# Patient Record
Sex: Female | Born: 1940 | Race: White | Marital: Married | Smoking: Never smoker
Health system: Northeastern US, Academic
[De-identification: ages and names within clinical notes are randomized; demographics above are authoritative.]

## PROBLEM LIST (undated history)

## (undated) DIAGNOSIS — I10 Essential (primary) hypertension: Secondary | ICD-10-CM

## (undated) HISTORY — DX: Essential (primary) hypertension: I10

## (undated) HISTORY — PX: BACK SURGERY: SHX140

---

## 2006-07-30 DIAGNOSIS — E785 Hyperlipidemia, unspecified: Secondary | ICD-10-CM | POA: Insufficient documentation

## 2006-07-30 DIAGNOSIS — I10 Essential (primary) hypertension: Secondary | ICD-10-CM | POA: Insufficient documentation

## 2006-09-04 DIAGNOSIS — M129 Arthropathy, unspecified: Secondary | ICD-10-CM | POA: Insufficient documentation

## 2006-09-04 DIAGNOSIS — M549 Dorsalgia, unspecified: Secondary | ICD-10-CM | POA: Insufficient documentation

## 2006-09-04 DIAGNOSIS — IMO0001 Reserved for inherently not codable concepts without codable children: Secondary | ICD-10-CM | POA: Insufficient documentation

## 2019-04-06 ENCOUNTER — Ambulatory Visit: Payer: Medicare Other | Admitting: Psychiatry

## 2019-04-06 ENCOUNTER — Encounter: Payer: Self-pay | Admitting: Psychiatry

## 2019-04-06 VITALS — BP 148/80 | Temp 95.5°F | Ht 67.0 in | Wt 194.0 lb

## 2019-04-06 DIAGNOSIS — R0989 Other specified symptoms and signs involving the circulatory and respiratory systems: Secondary | ICD-10-CM

## 2019-04-06 DIAGNOSIS — R059 Cough, unspecified: Secondary | ICD-10-CM

## 2019-04-06 DIAGNOSIS — R05 Cough: Secondary | ICD-10-CM

## 2019-04-06 DIAGNOSIS — K219 Gastro-esophageal reflux disease without esophagitis: Secondary | ICD-10-CM

## 2019-04-06 NOTE — Progress Notes (Signed)
Katrina Alexander was referred by Dr. No ref. provider found.    Subjective  Chief Complaint: Katrina Alexander presents today for cough      HPI: Katrina Alexander reports that Katrina Alexander was having a sensation of postnasal drip.  Katrina Alexander uses fluticasone nasal spray and reports that initially this has been helpful to Katrina Alexander.  However, now Katrina Alexander is concerned that Katrina Alexander is having worsening postnasal drip as Katrina Alexander finds Katrina Alexander is coughing.  Katrina Alexander has a sensation of mucus in Katrina Alexander throat. Katrina Alexander reports that Katrina Alexander cough is often worse at night.  Katrina Alexander is on pantoprazole for acid reflux and recently had the addition of Pepcid.  In discussion on Katrina Alexander medications, Katrina Alexander reports that Katrina Alexander recently was started on lisinopril with hydrochlorothiazide and had previously only been on hydrochlorothiazide.  Katrina Alexander does not believe that Katrina Alexander was on lisinopril at all up until the recent addition of it in combination to the hydrochlorothiazide.  Katrina Alexander believes Katrina Alexander cough started after the addition of the lisinopril.        Outpatient Medications Marked as Taking for the 04/06/19 encounter (PA Office Visit) with Eliezer Bottom, PA   Medication Sig Dispense Refill    benzonatate (TESSALON) 200 MG capsule Take 200 mg by mouth 3 times daily as needed      famotidine (PEPCID) 20 MG tablet Take 20 mg by mouth 2 times daily as needed      lisinopril-hydrochlorothiazide (PRINZIDE,ZESTORETIC) 10-12.5 MG per tablet Take 1 tablet by mouth daily      LORazepam (ATIVAN) 1 MG tablet SMARTSIG:0.5 Tablet(s) By Mouth Twice Daily PRN      ondansetron (ZOFRAN-ODT) 8 MG disintegrating tablet Take 8 mg by mouth every 8 hours      simvastatin (ZOCOR) 40 MG tablet Take 40 mg by mouth daily      oxyCODONE-acetaminophen (PERCOCET) 5-325 MG per tablet Take 1 tablet by mouth every 6 hours as needed      fluticasone (FLONASE) 50 MCG/ACT nasal spray Spray 1 spray into nostril daily      pantoprazole (PROTONIX) 20 MG EC tablet   0    aspirin 81 MG tablet TAKE 1 TABLET DAILY.  0    hydrochlorothiazide (HYDRODIURIL)  12.5 MG tablet   0       Allergy:  Nsaids and Penicillins    Problem List: has Lower Back Pain Chronic; Hyperlipidemia; Hypertension; Arthropathy Of The Head / Neck / Trunk; Backache; and Myalgia And Myositis on their problem list.    Medical History:   Past Medical History:   Diagnosis Date    Hypertension        Surgical History:   Past Surgical History:   Procedure Laterality Date    BACK SURGERY          Family History: family history includes Brain Aneurysm in Katrina Alexander mother; Diabetes in Katrina Alexander brother; Heart failure in Katrina Alexander sister; Leukemia in Katrina Alexander brother; Prostate cancer in Katrina Alexander father.    Social History:   Social History     Tobacco Use    Smoking status: Never Smoker    Smokeless tobacco: Never Used   Substance Use Topics    Alcohol use: Not on file         Objective  Vitals:    04/06/19 1408   BP: 148/80   Temp: 35.3 C (95.5 F)   Weight: 88 kg (194 lb)   Height: 1.702 m (5\' 7" )       Body mass index is 30.38 kg/m.  Exam  Constitution:  Appears well developed and well nourished. No signs of acute distress present. Katrina Alexander is cooperative and overall behavior is appropriate.  Head / Face: Atraumatic, normocephalic on inspection. No scars present..  Ears: Inspection reveals no lesion of the ears, swelling of the ears, or tenderness of the ears.  No discharge, mass or stenosis in the auditory canals. translucent, normal landmarks, and no retraction. No swelling or tenderness of the post auricular area bilaterally.  Nose: External Nose: exhibits no deformity or lesions. Internal Nose: No discharge from the nasal mucosae, no edema, no erythema of the nasal mucosae. Nasal Septum: not significantly deviated or perforated. Nasal turbinates are normal in color and size.  Oral cavity: Lips appear normal and healthy. Dentition is normal for age. Gums appear healthy. Tongue shows a smooth surface and symmetry. Floor of the mouth appears normal. Salivary glands normal in size with no asymmetry. Submandibular and  parotid ducts are patent bilaterally. Oral mucosa moist with no thrush and no mucositis. Hard palate normal in appearance.  Oropharynx: No lesions or masses. Soft palate normal in appearance. Uvula midline and normal in size. Posterior pharyngeal mucosa appears normal.  Neck: Symmetric. Palpation reveals no swelling or tenderness. No masses appreciated. Trachea is midline and has good landmarks. Thyroid exhibits no palpable enlargement, nodules or tenderness on palpation.  Respiratory: Respiration rate is normal.  Neurological: Alert and oriented x 3.  Psychological: Mood is normal. Katrina Alexander's affect is appropriate to mood. Speech is spontaneous with regular rate, rhythm, and volume.     Nasopharyngoscopy    verbal consent given for procedure.    After 5-10 minutes, flexible endocope inserted through both nasal cavities.    Normal nasal cavity, nasopharynx, hypopharynx and larynx.  Normal tongue base.  Normal bilateral true vocal cord motion.  No mass lesions identified.       The procedure lasted less than 5 minutes and the endoscope was then removed.  The Katrina Alexander tolerated the procedure well without complications.        Assessment  79 y.o. female    ICD-10-CM ICD-9-CM    1. Gastroesophageal reflux disease, unspecified whether esophagitis present  K21.9 530.81    2. Cough  R05 786.2    3. Globus sensation  R09.89 306.4          Plan    Given benign exam, reported timing of symptoms and medication changes, I suspect that the addition of lisinopril is the culprit for Katrina Alexander cough.     That being said, we discussed post nasal drip    The term "post nasal drip" is non-specific and means different things to different patients.  Often this term is used to describe a subacute or chronic cough that does not clear with treatment, typically associated with a feeling of fullness in the throat. The phrase "upper airway cough syndrome" (UACS) is now preferred. This is because it is much more likely that the mechanism of cough is  not due to the drainage of secretions from the nose or paranasal sinuses into the pharynx, but rather the direct inflammation / irritation of cough receptors in the upper airway.    Many physicians with an interest in chronic cough have challenged the existence of UACS as a distinct clinical entity. The diagnostic precision of clinical assessment for this syndrome has also been challenged as there are no broadly accepted definitions, pathologic tissue changes, and available biochemical tests. Expert opinion is moving towards a description of many of the features  of UACS as being part of a general cough hypersensitivity syndrome.    I do not see any specific evidence of sinusitis or other head and neck pathology on physical examination today.  I do not recommend a sinus CT as it is unlikely to change the treatment recommendations and there are often subtle abnormalities on CT that  tend to be confusing and of little help.  Absent more classic symptoms of sinusitis, I am highly unlikely to recommend sinus surgery for a primary complaint of cough. Katrina Alexander may certainly continue to utilize the fluticasone nasal spray    The role of reflux in chronic cough is also controversial.  We are not able to determine if there is GERD or LPR on physical examination.  Many experts in LPR recommend high dose empiric treatment with double dose PPI in the morning and double dose H2 blockers at night for at least 3 months.  Katrina Alexander is on protonix with recent addition of famotidine    I will defer to Katrina Alexander primary care provider. Consideration could be had into changing the lisinopril or allowing the famotidine more time in event that this is lprd related. If none of these are thought the cause, Katrina Alexander could also be referred to laryngology but will defer to primary care       The assessment, plan of care, and management were discussed with Katrina Alexander.   Questions were answered to  their satisfaction.     This progress note and the management plan  agreed upon by both this provider and Katrina Alexander, will be communicated to the referring provider and/or the PCP.      Seen by : Carlis Stable, PA4/01/2019

## 2020-01-09 IMAGING — US US BREAST RT LTD
1 series · 8 of 8 positions shown · non-contrast
Comparison: The present examination has been compared to prior imaging studies.

HISTORY: Patient is 79 years old and is seen for diagnostic evaluation of cyst in the upper inner quadrant of the left breast. The patient has no personal history of cancer. The patient has the following family history of breast cancer:  sister, at age 76, breast cancer.
TECHNIQUE: Bilateral 2-D digital diagnostic mammogram was performed followed by 3-D tomosynthesis. Real-time targeted ultrasound of the left breast was performed.  Current study was also evaluated with a computer aided detection (CAD) system.

MAMMOGRAM FINDINGS:
The breasts are heterogeneously dense, which may obscure small masses.
No suspicious abnormality is seen in either breast.
ULTRASOUND FINDINGS:
No suspicious abnormality is seen in the left breast. Right breast ultrasound is negative.

[Series 1: us breast right ltd · 8 of 8 slices shown]
[im 1/8]
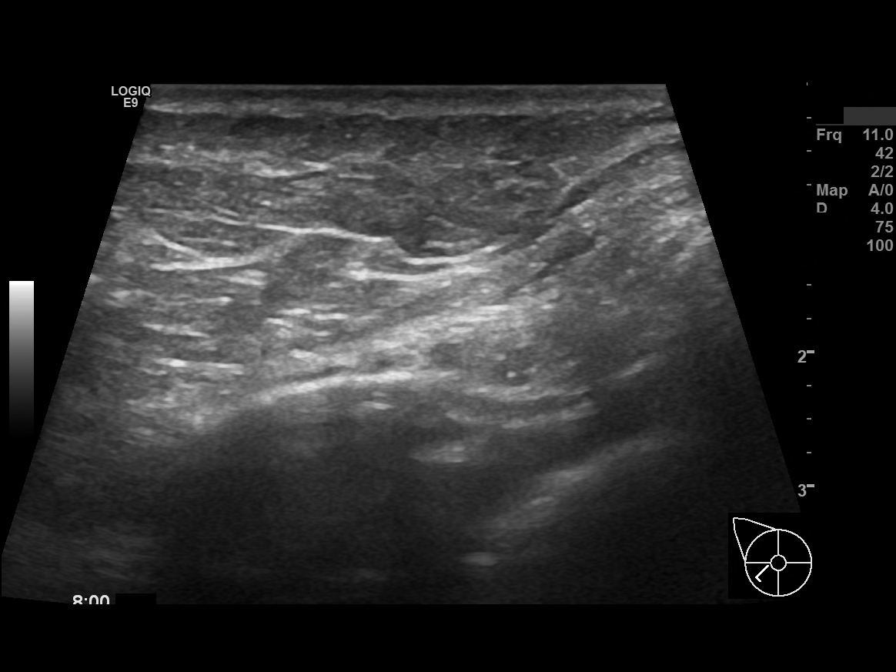
[im 2/8]
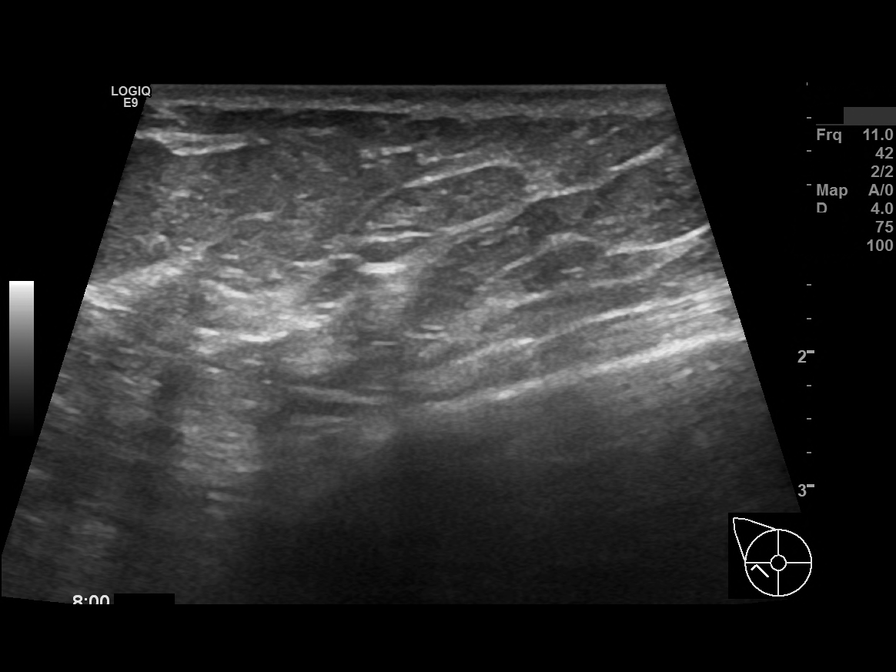
[im 3/8]
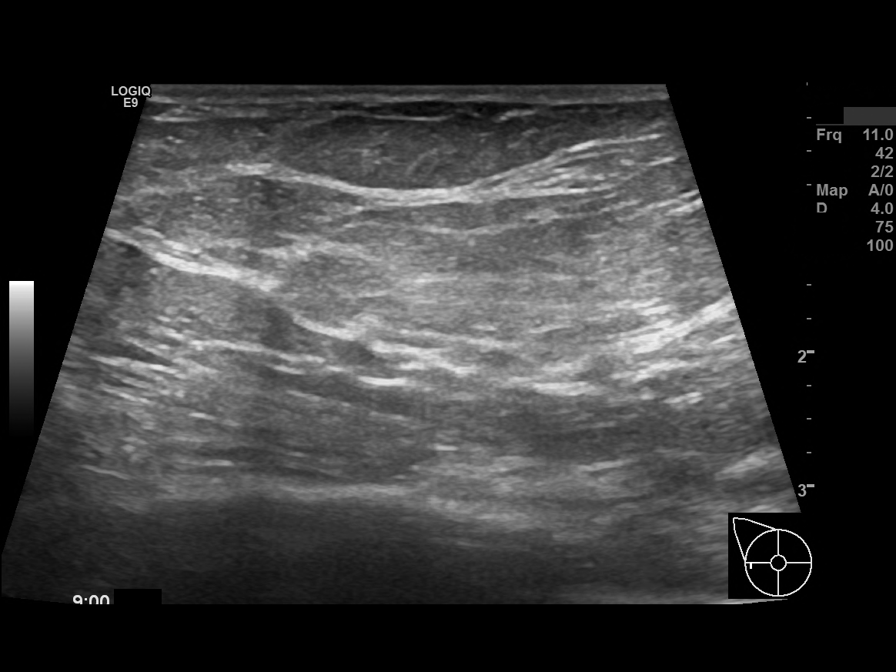
[im 4/8]
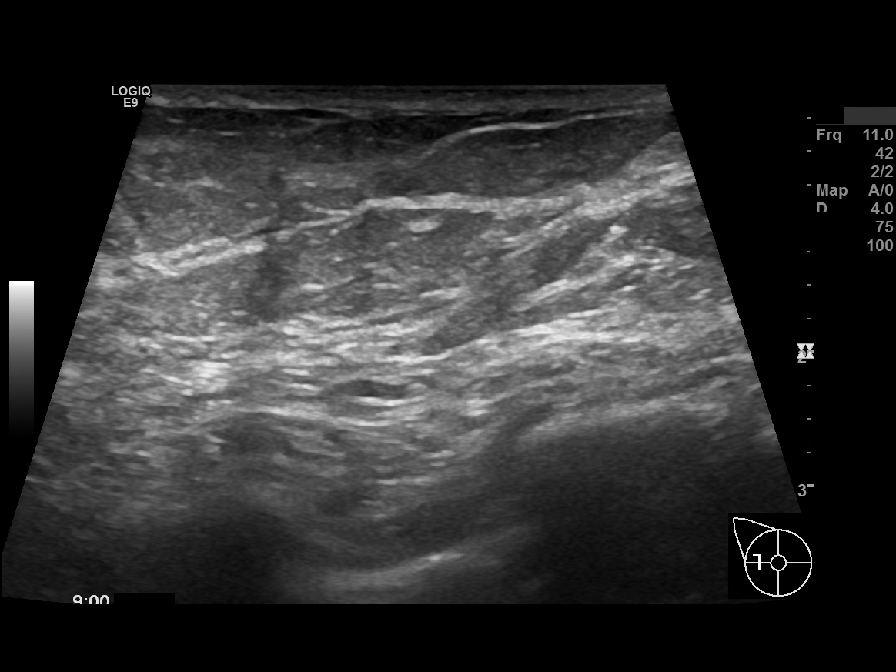
[im 5/8]
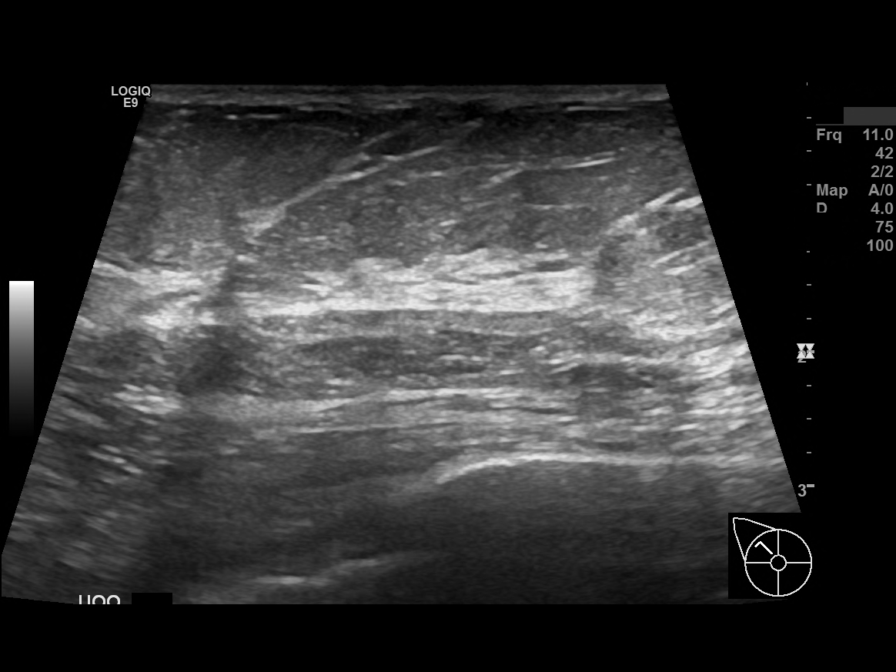
[im 6/8]
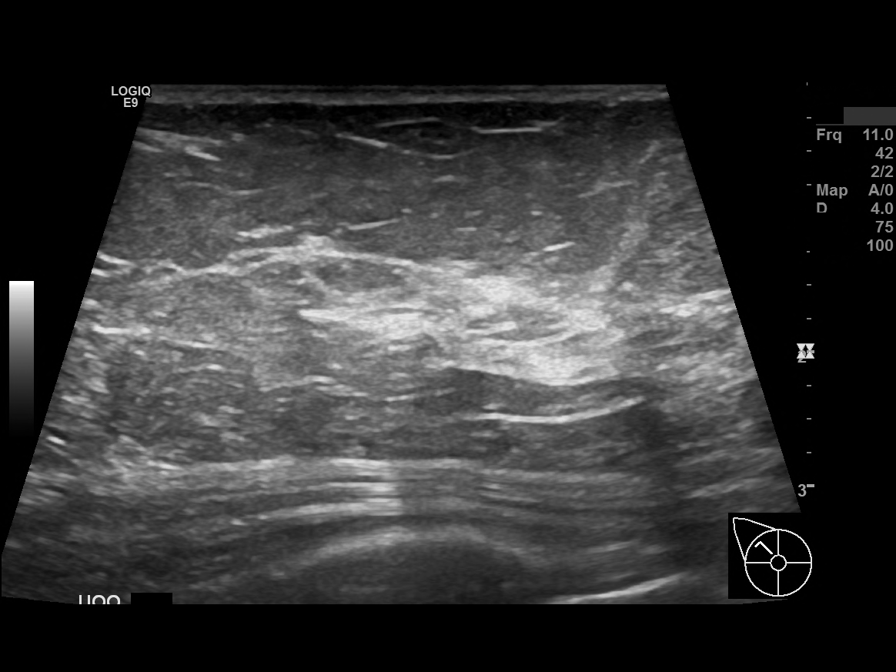
[im 7/8]
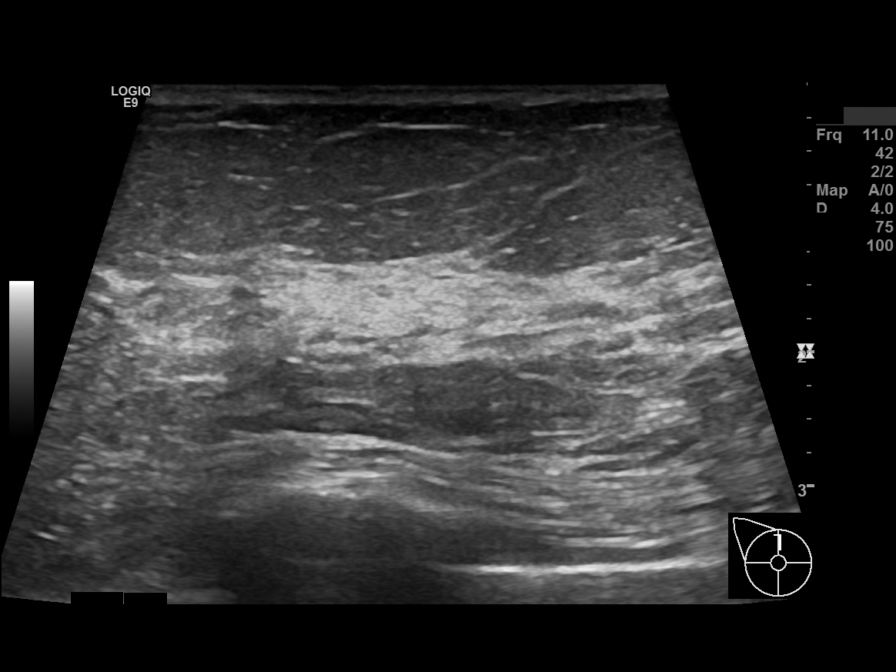
[im 8/8]
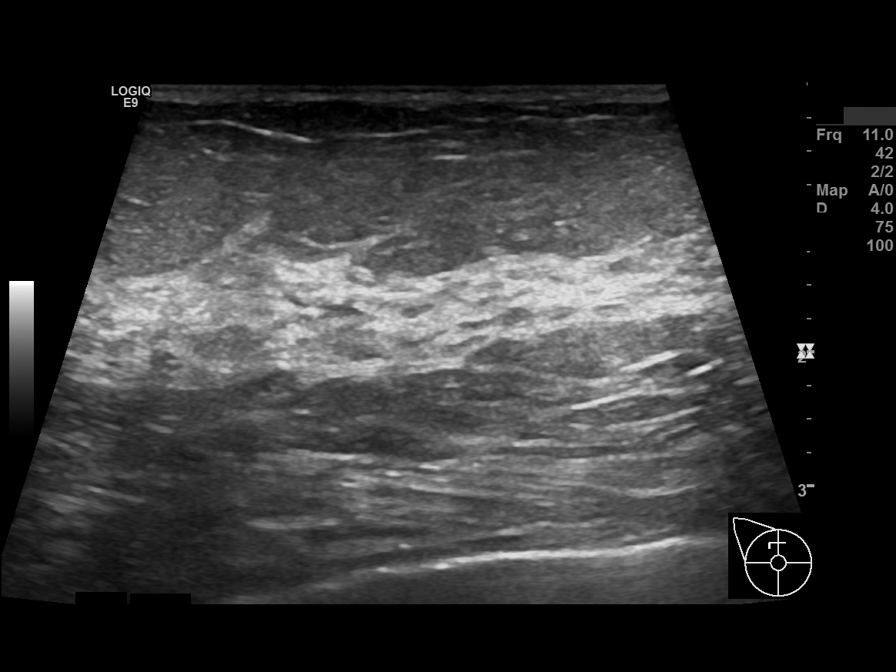

[8 of 8 positions shown; findings below may reference images not displayed]

IMPRESSION: There is no mammographic or sonographic evidence of malignancy.

A return to screening in 1 year is recommended.

The patient was given a copy of her results at the time of her visit.

BI-RADS Category 1: Negative

## 2020-01-09 IMAGING — MG MAMMO DIAG BIL W/CAD TOMO
8 series · 8 of 24 positions shown · non-contrast
Comparison: The present examination has been compared to prior imaging studies.

HISTORY: Patient is 79 years old and is seen for diagnostic evaluation of cyst in the upper inner quadrant of the left breast. The patient has no personal history of cancer. The patient has the following family history of breast cancer:  sister, at age 76, breast cancer.
TECHNIQUE: Bilateral 2-D digital diagnostic mammogram was performed followed by 3-D tomosynthesis. Real-time targeted ultrasound of the left breast was performed.  Current study was also evaluated with a computer aided detection (CAD) system.

MAMMOGRAM FINDINGS:
The breasts are heterogeneously dense, which may obscure small masses.
No suspicious abnormality is seen in either breast.
ULTRASOUND FINDINGS:
No suspicious abnormality is seen in the left breast. Right breast ultrasound is negative.

[L MLO]
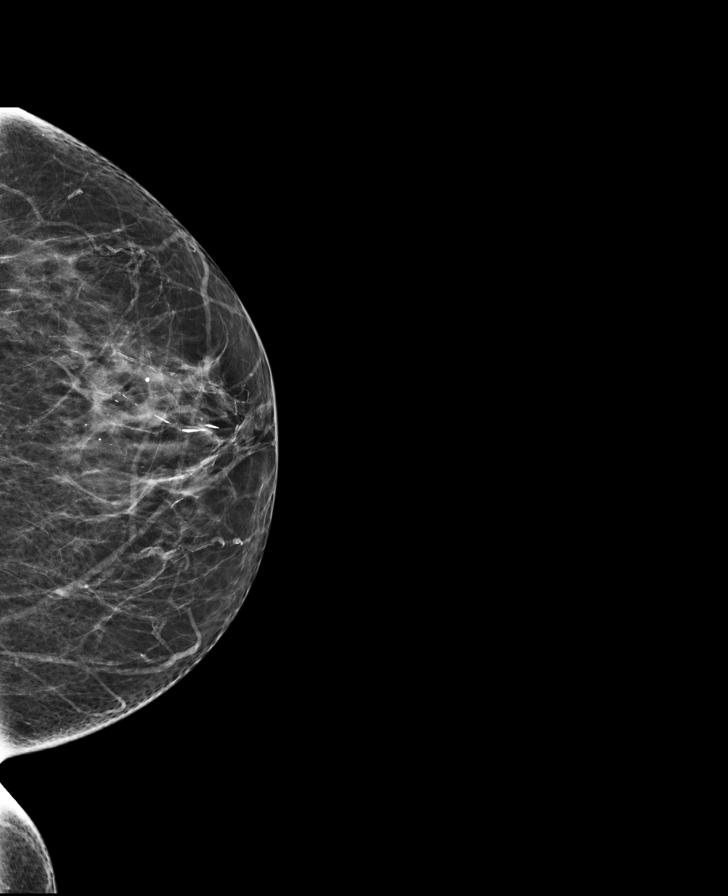

[L CC]
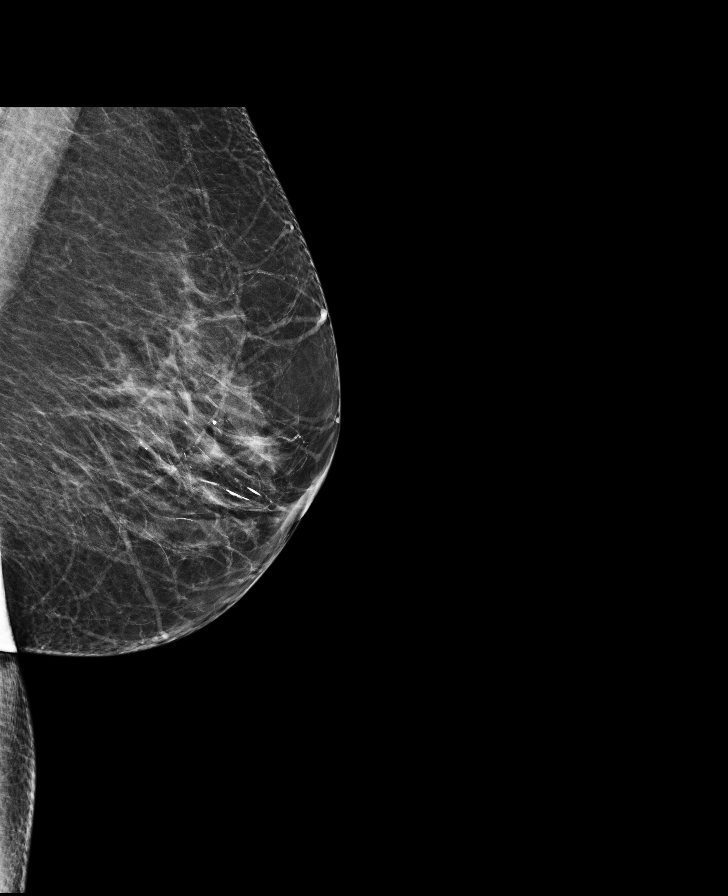

[R MLO]
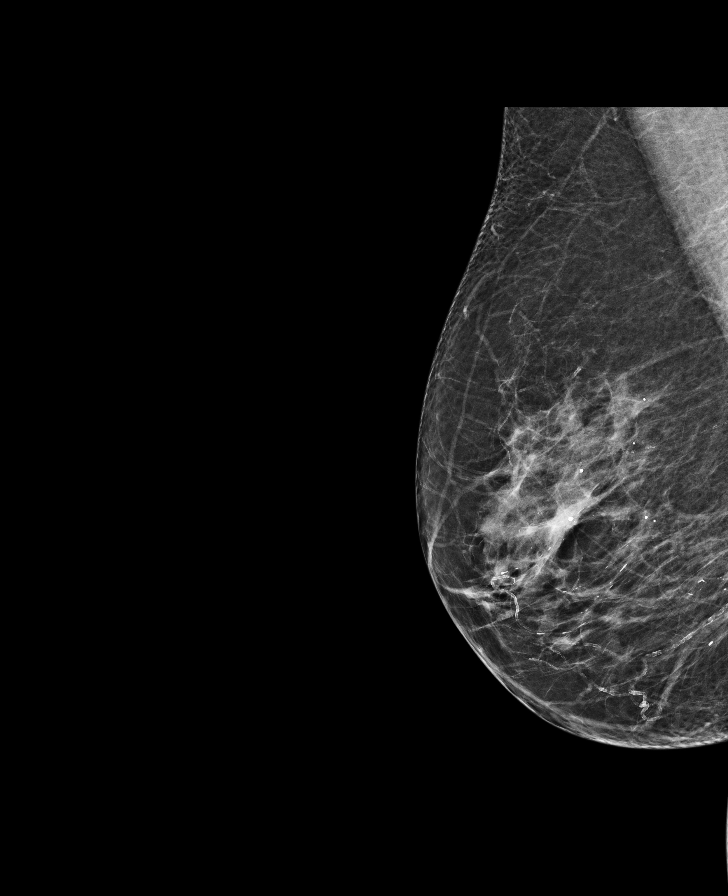

[R CC]
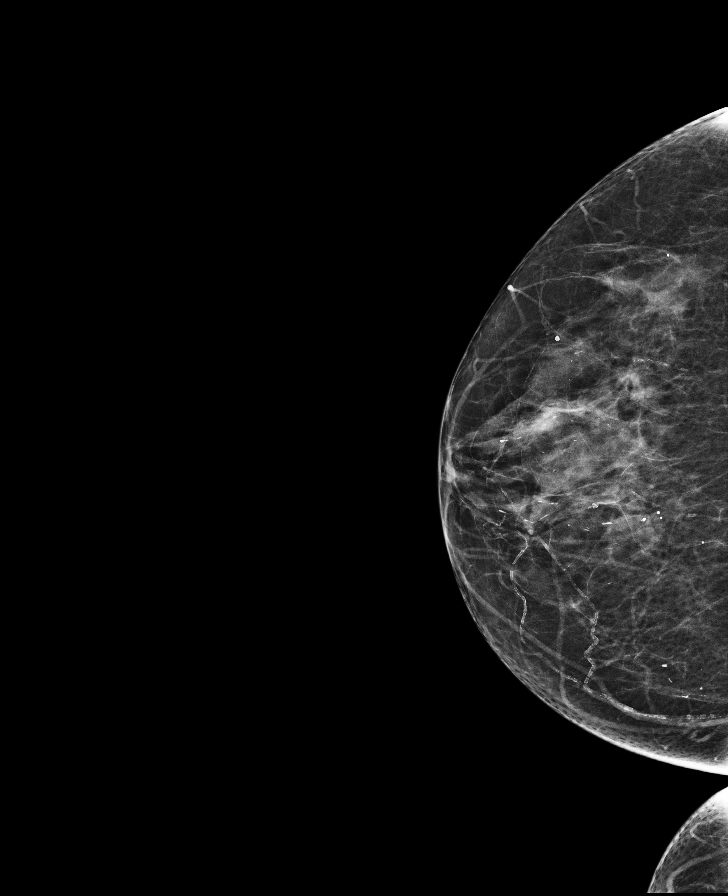

[L CC tomo · tomo slice 34/67.0]
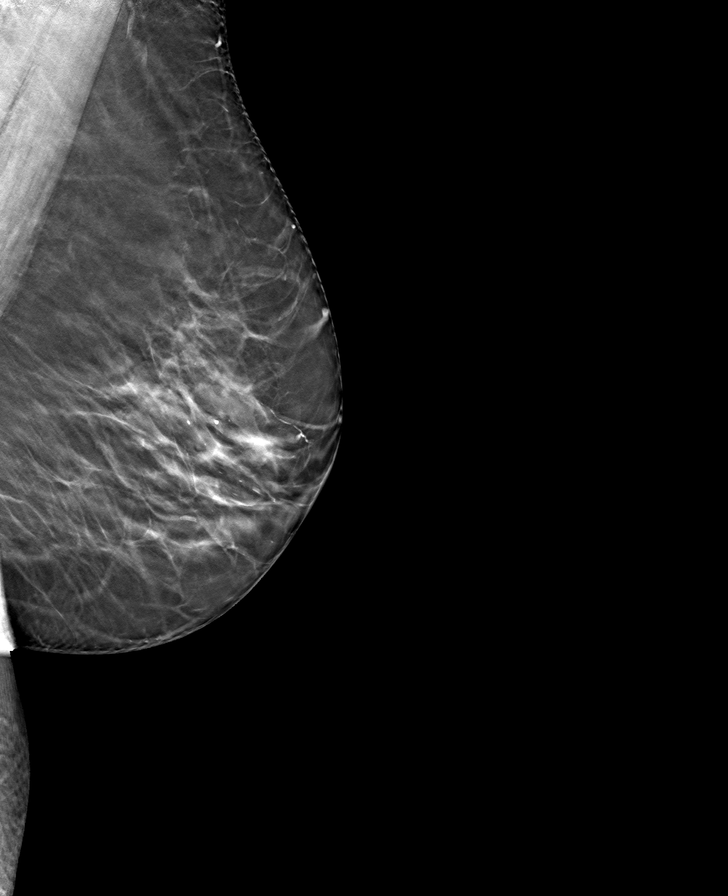

[R CC tomo · tomo slice 29/57.0]
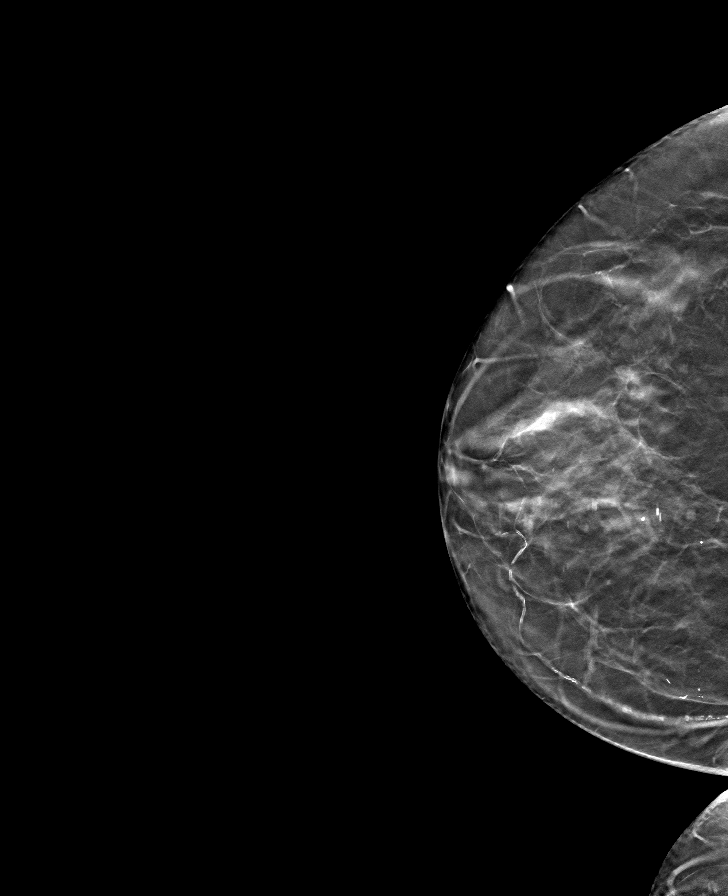

[R MLO tomo · tomo slice 33/65.0]
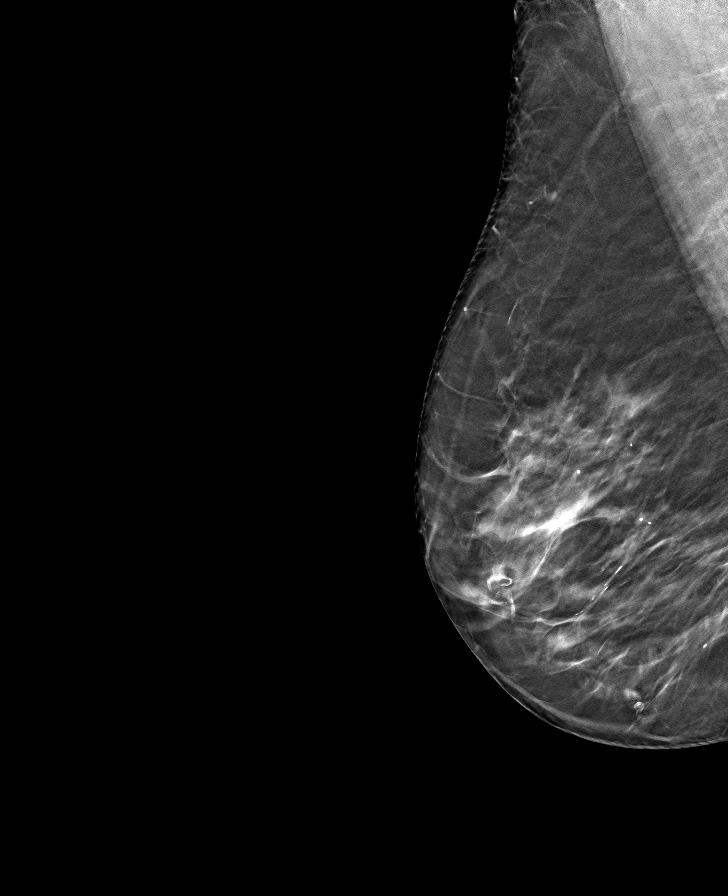

[L MLO tomo · tomo slice 31/60.0]
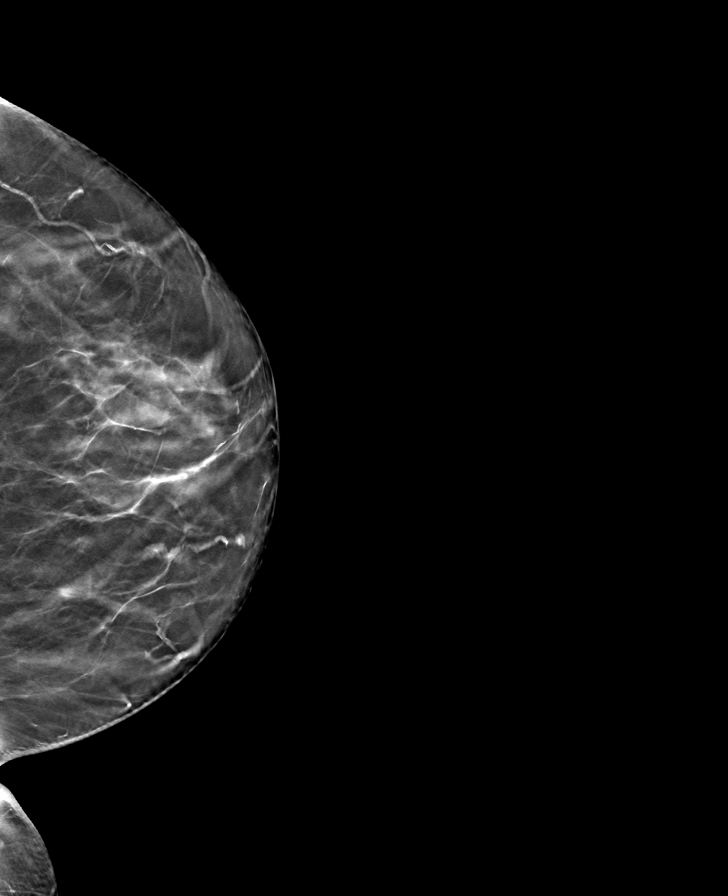

[8 of 24 positions shown; findings below may reference images not displayed]

IMPRESSION: There is no mammographic or sonographic evidence of malignancy.

A return to screening in 1 year is recommended.

The patient was given a copy of her results at the time of her visit.

BI-RADS Category 1: Negative

## 2020-01-15 IMAGING — CR L-SPINE 4 VWS MIN
5 series · 5 of 5 positions shown · non-contrast
Comparison: None

HISTORY: 79 year old female with lumbar pain
TECHNIQUE: 5 view radiograph of the lumbar spine.

[t lumbar spine ap]
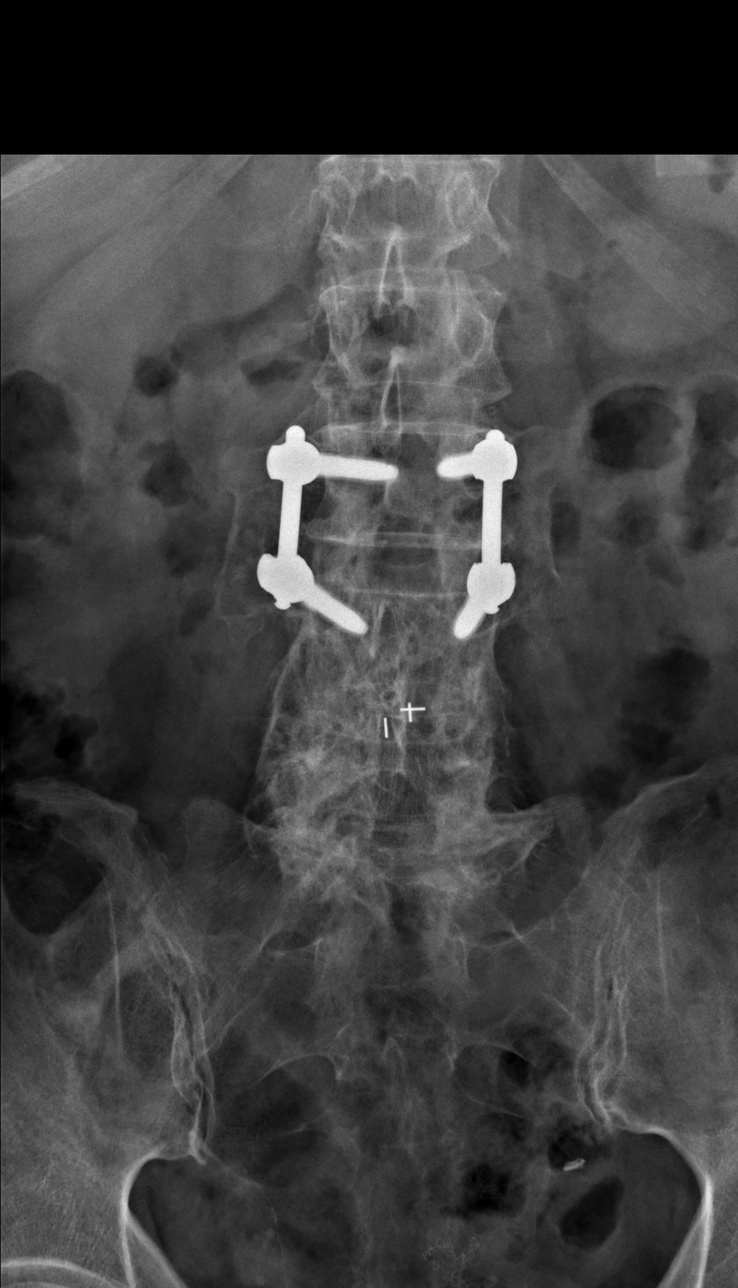

[t lumbar spine obl]
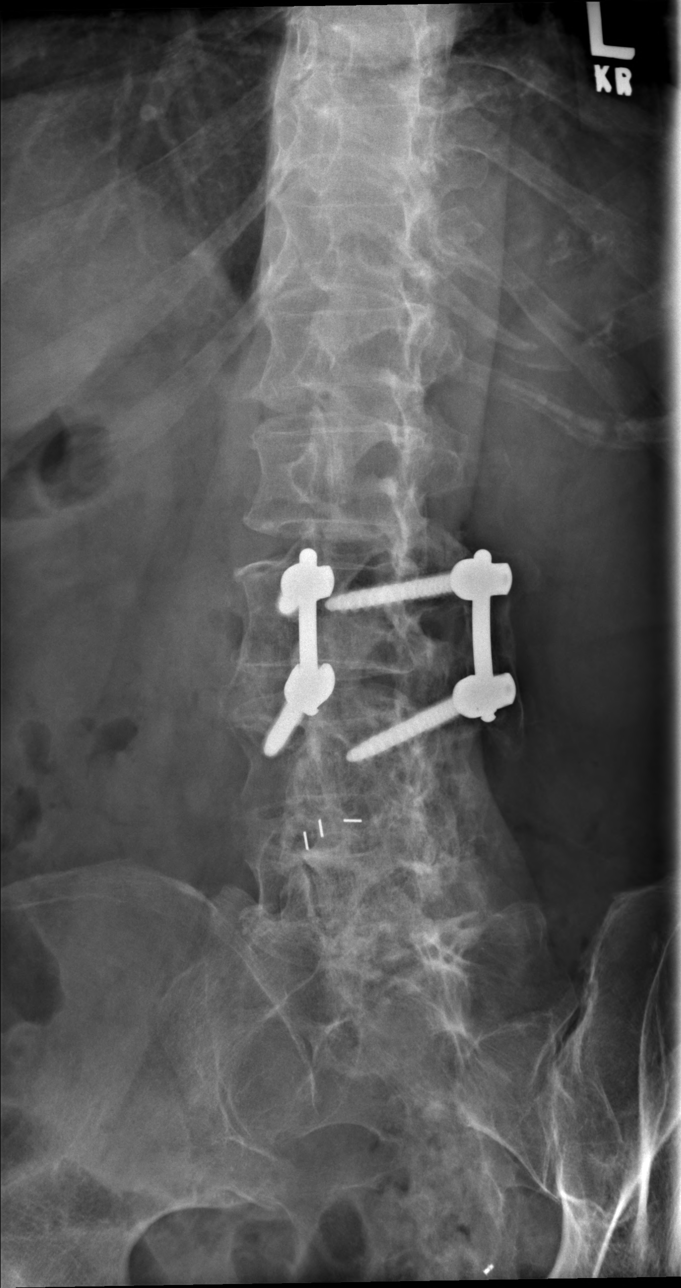

[t lumbar spine lat (1 of 2)]
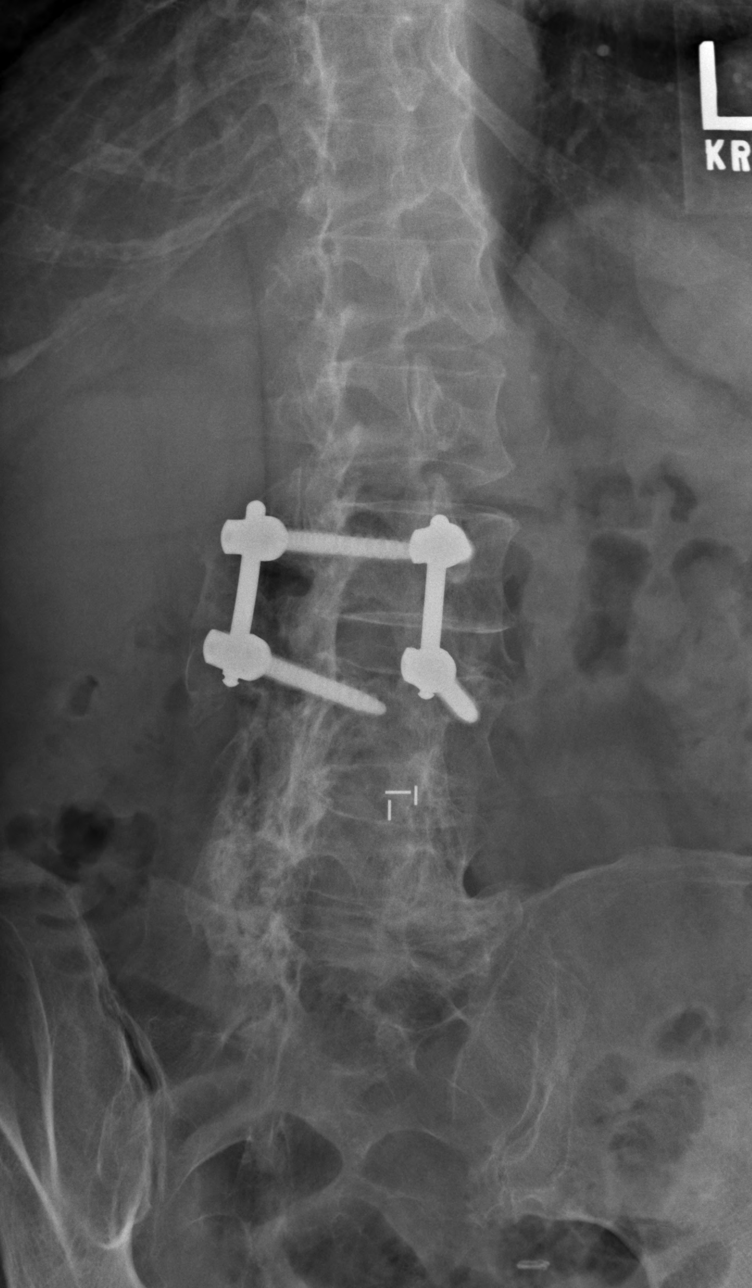

[t lumbar spine lat (2 of 2)]
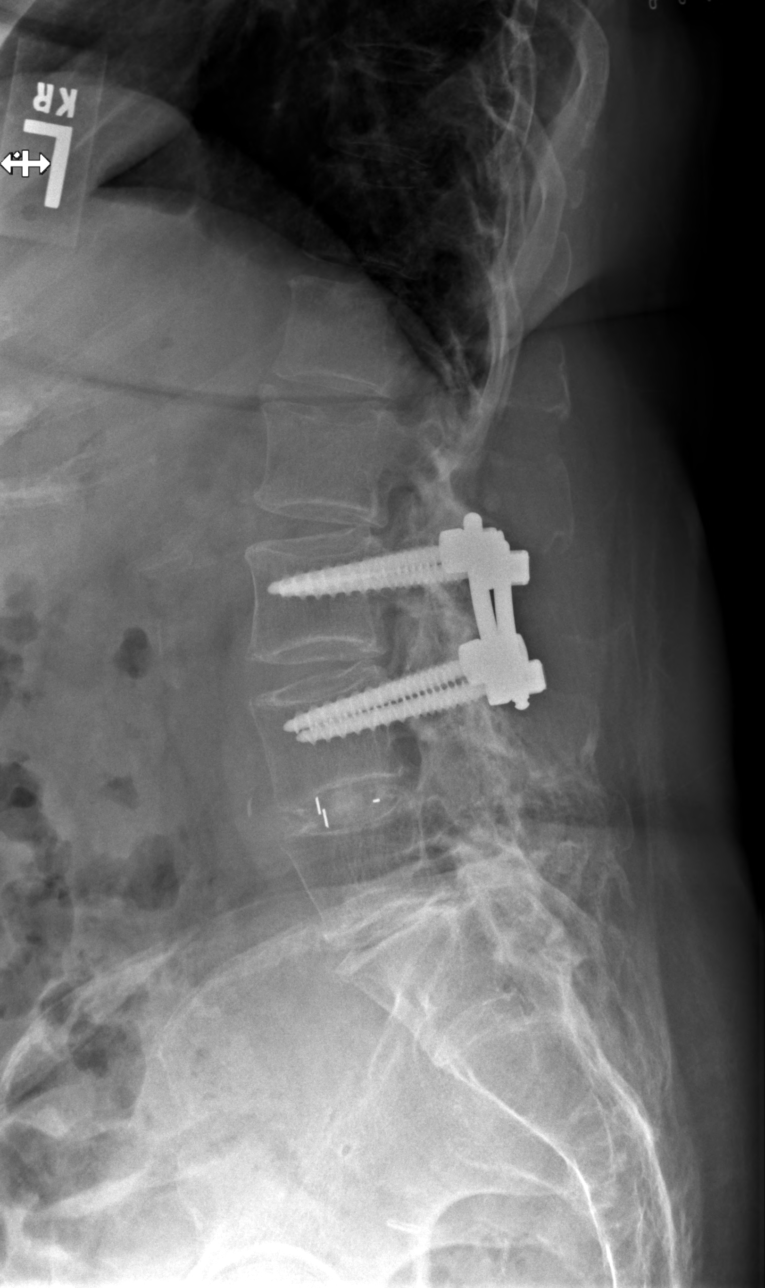

[t lumbar l-5 s-1 spot]
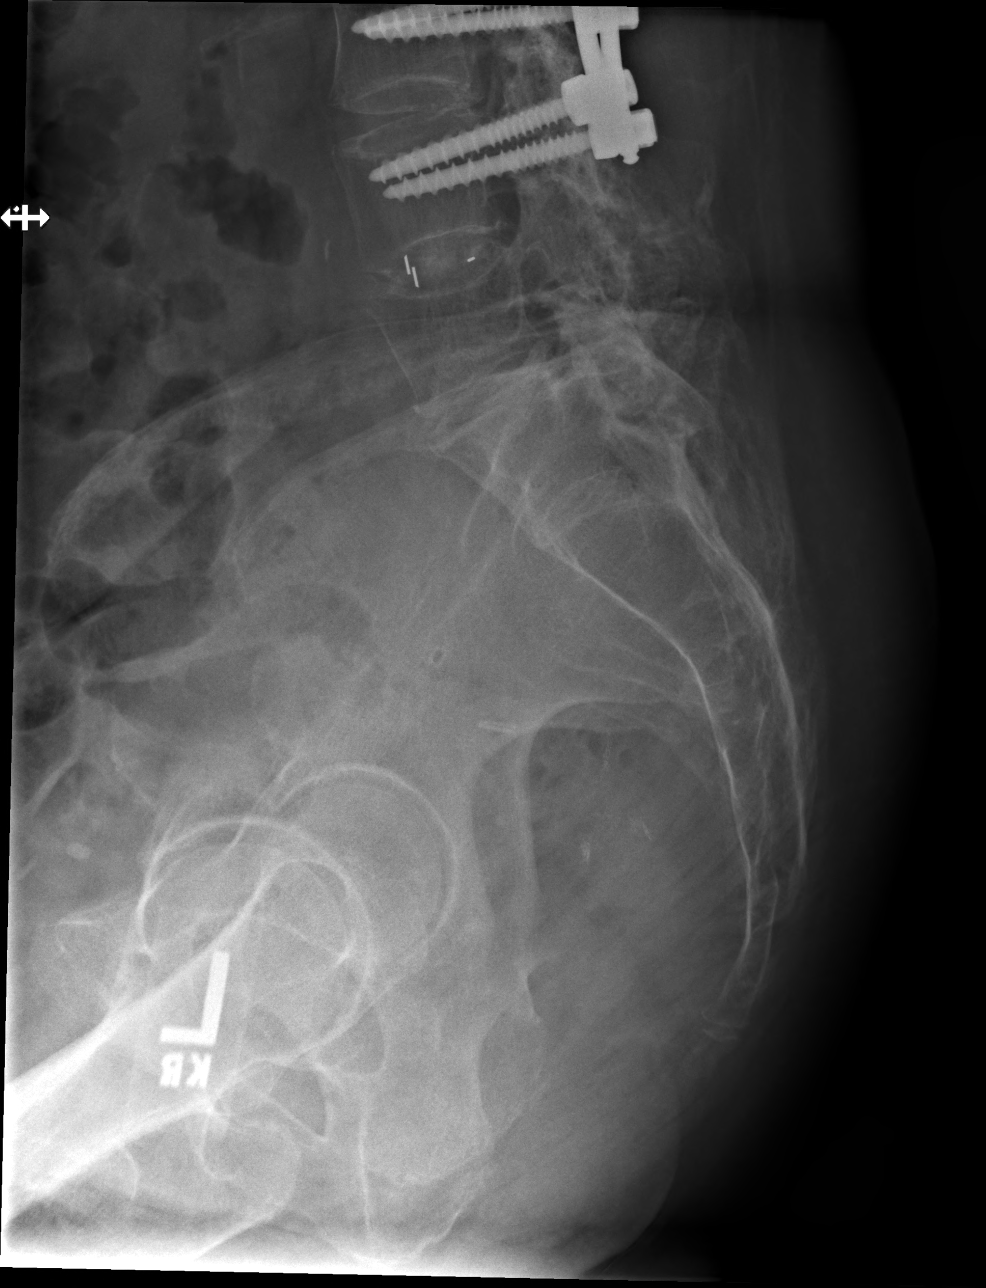

[5 of 5 positions shown; findings below may reference images not displayed]

FINDINGS: Please note that spinal labeling was performed assuming there are 5 non-rib-bearing vertebrae.

Suspected bone demineralization.

Previous posterior and symmetric fusion at L3-4 and interbody cage placement at L4-5. No significant perimetallic lucency or evidence of hardware migration or fracture.

Mild straightening of the lordosis. No subluxations. Vertebral body heights are maintained. Multilevel degenerative disc disease and facet arthropathy.

Moderate bilateral SI joint osteoarthritis.
IMPRESSION: 1.
Suspected bone demineralization.

2.
Postoperative changes without interval hardware complication.

3.
Multilevel spondylotic changes.

4.
Moderate bilateral SI joint osteoarthritis.

## 2020-02-26 IMAGING — OT DXA BONE DENSITY
2 series · 2 of 2 positions shown · non-contrast
Comparison: none

REASON FOR EXAM: Evaluate bone density.

RISK FACTORS:  None
PRIOR EXAMS:  None
METHOD:  Scans of the spine and hip were performed using dual energy X-ray densitometry (DXA) with the [employer] SL (S/LWPHTP)  system.

[Series 1: — · 1 of 1 slices shown (1 of 2)]
[im 1/1]
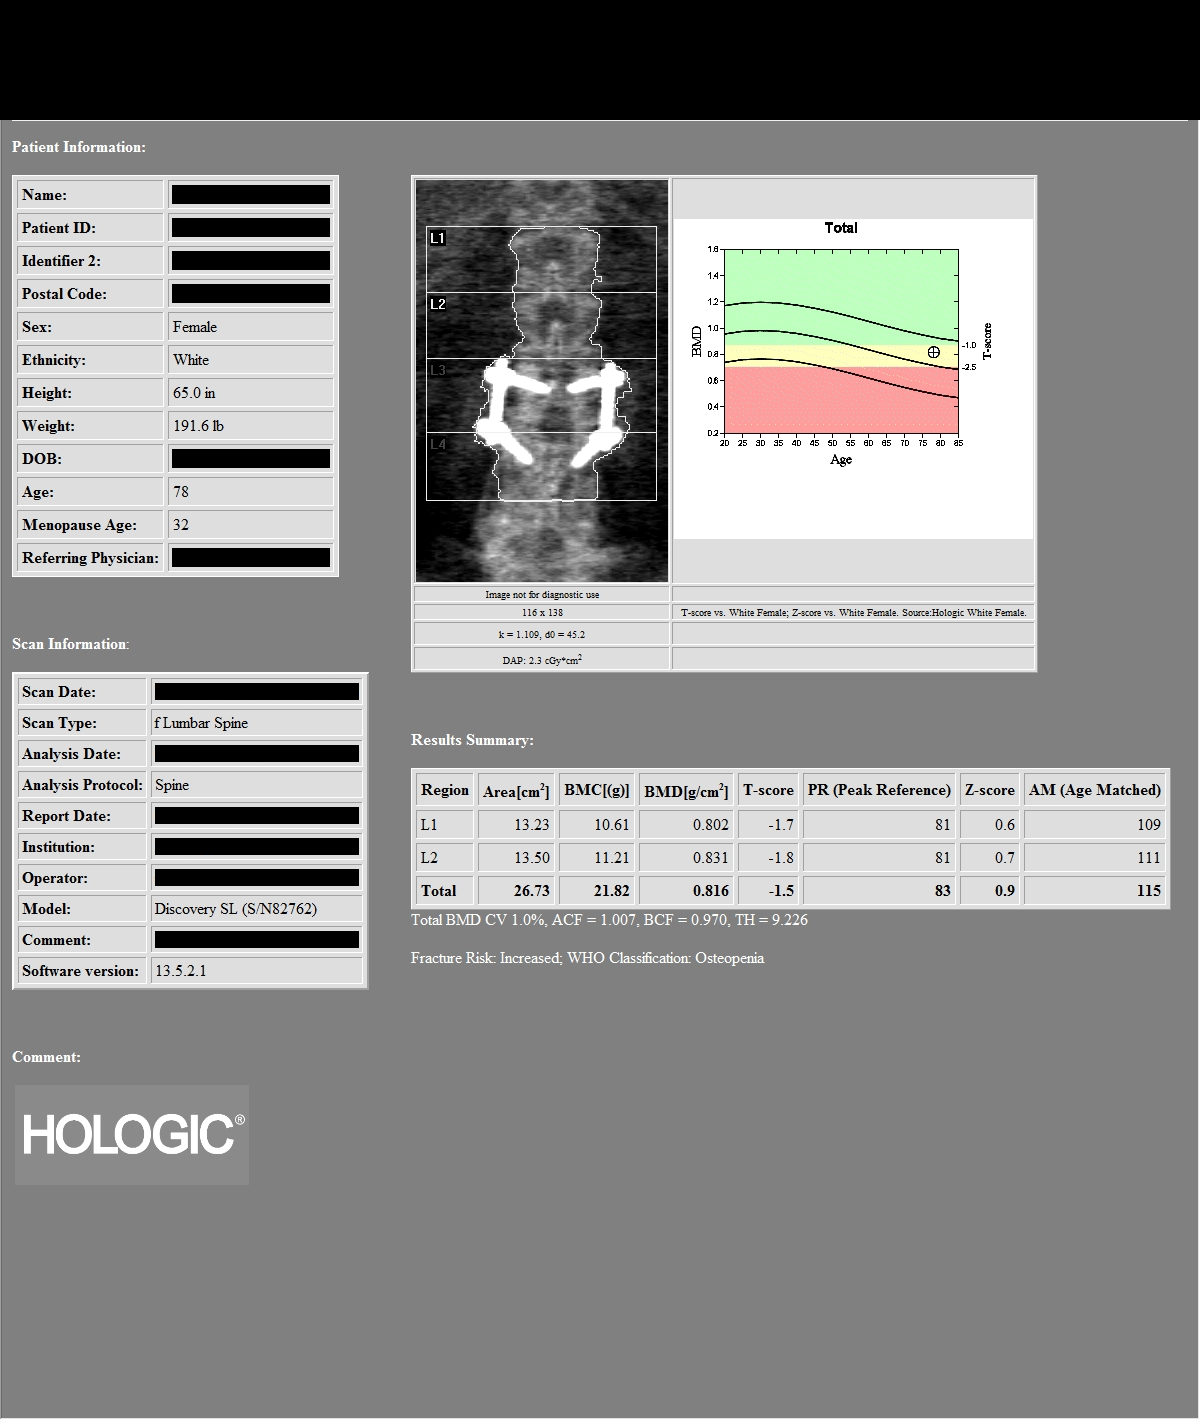

[Series 2: — · left · 1 of 1 slices shown (2 of 2)]
[im 1/1]
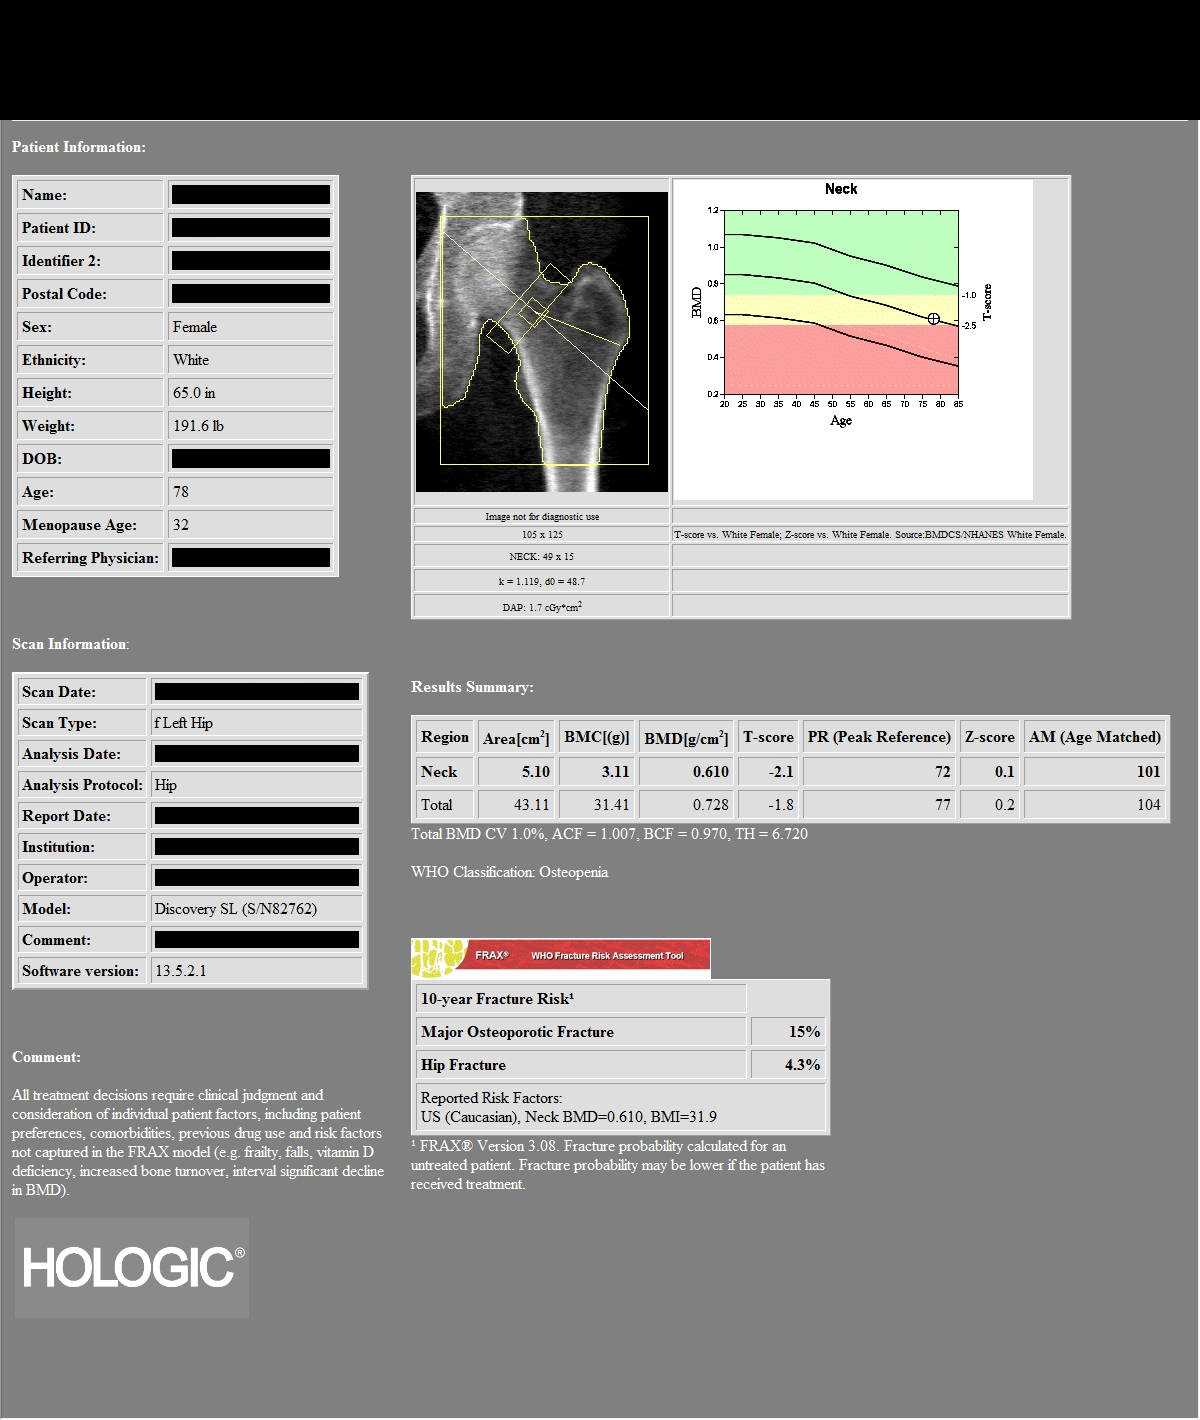

[2 of 2 positions shown; findings below may reference images not displayed]

IMPRESSION: As defined by World Health Organization, the patient meets the criteria for OSTEOPENIA based on spine and left hip T-scores.

PATIENT DEMOGRAPHICS:  79-year-old white  female.
FINDINGS: 1.    Review of scanogram images shows posterior intervertebral fusion at L3-L4 with pedicles screws and posterior bars; for this reason L3 and L4 vertebrae were not included in evaluation..  

2.    The lumbar spine exam using L1-L2 regions shows average Bone Mineral Density is 0.816 gm/cm2 of Hydroxyapatite.  The T-score (comparing patient with a young adult group) is 1.5 standard deviations BELOW mean. The Z-score (comparing patient with an age-matched group) is 0.9 standard deviations ABOVE mean.

3.  The left hip exam using femoral neck region of interest shows average Bone Mineral Density is 0.610 gm/cm2 of Hydroxyapatite. The T-score (comparing patient with a young adult group) is 2.1 standard deviations BELOW mean. The Z-score (comparing patient with an age-matched group) is 0.1 standard deviations ABOVE mean.

According to the World Health Organization risk assessment tool (FRAX) for osteopenia only, which uses the femoral neck T score and includes other patient risk factors for fracture, the patient has a 10-year absolute risk of hip fracture of 4.3% and 10-year absolute risk fracture for any major fracture of 15% .

RECOMMENDATIONS:  The patient states that she is taking supplements on a regular basis.  The patient should continue being a nonsmoker and regular exercise to patient tolerance would be of benefit.  The patient is currently not taking prescribed medication for prevention of bone loss.  According to criteria established by the National Osteoporosis Foundation, the patient DOES meet the current indications for prescribed medical therapy based on osteopenic parameters and 10 year probability of hip fracture of 4.3%. The National Osteoporosis Foundation now recommends followup DXA scanning every two years in patients at risk regardless of whether the patient is undergoing pharmacological treatment.

World Health Organization criteria for diagnosis, please see link below.

[URL]

## 2020-05-31 IMAGING — CR HEEL RT 2 VWS MIN
2 series · 2 of 2 positions shown · non-contrast
Comparison: Nothing of right heel or foot.

HISTORY: 79-year-old female, bilateral heel pain. Cortisone shots in the heels in the past
TECHNIQUE: Right heel x-ray 2 view. Craniocaudal and lateral view.

[x calcaneus axial right]
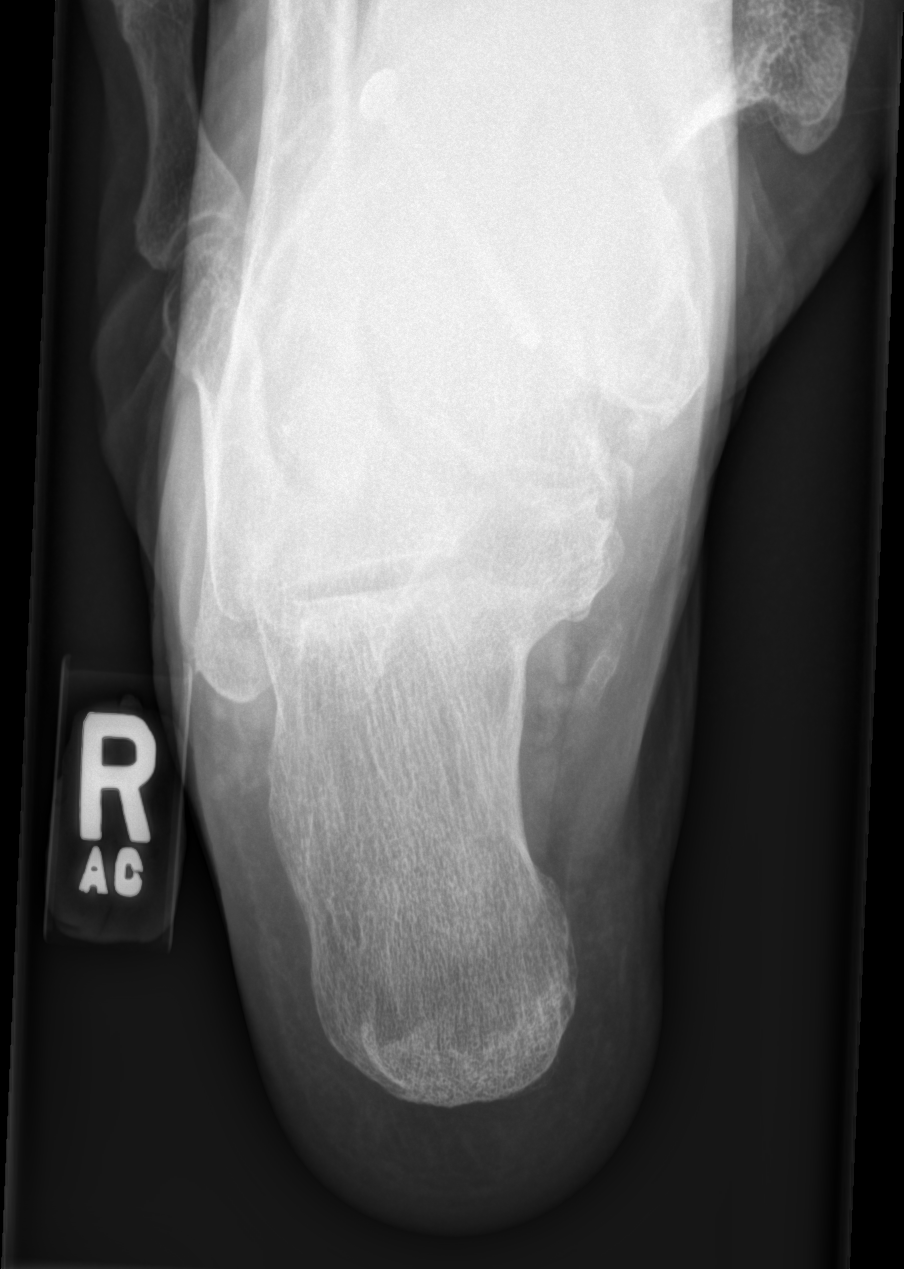

[x calcaneus lat right]
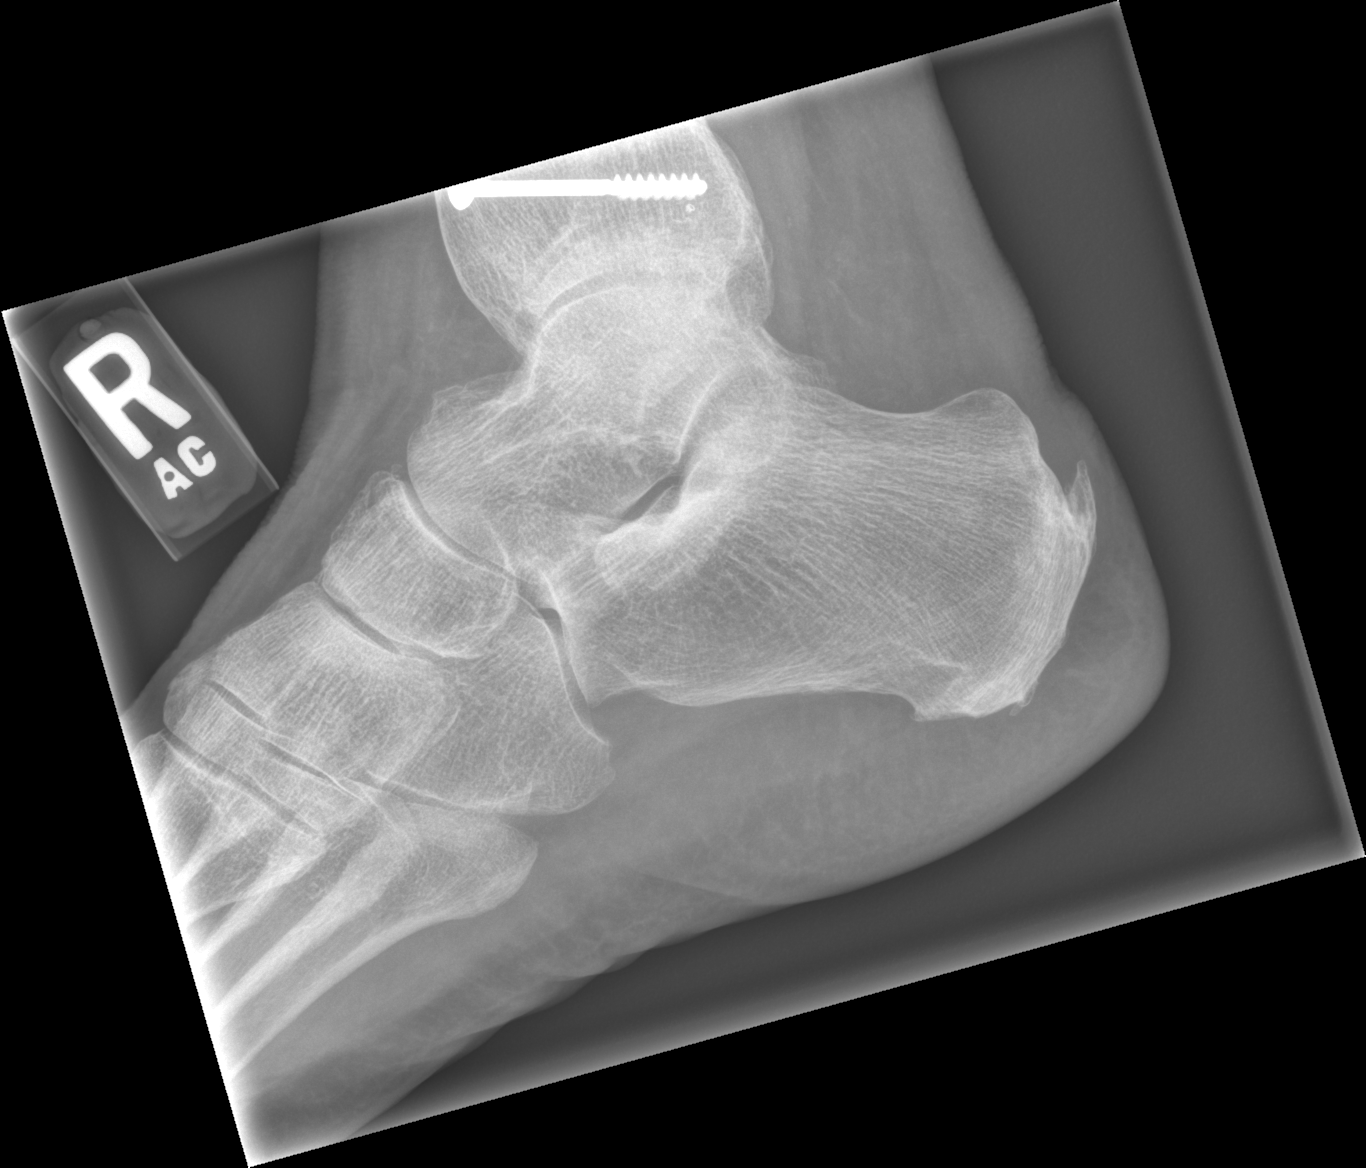

[2 of 2 positions shown; findings below may reference images not displayed]

FINDINGS: Metal screw is seen on lateral view in distal tibia. Achilles insertional spur measuring about 10 mm and a 1 mm plantar spur. No calcaneal fracture or destructive process. Subtalar joint shows no evidence for obvious coalition. Small amount of atherosclerosis.
IMPRESSION: 1. There is no calcaneal fracture.

2. Calcaneal enthesophyte particularly distal Achilles tendon.

## 2020-05-31 IMAGING — MR MRI BRAIN W/WO CONTRAST
13 series · 48 of 48 positions shown · IV contrast (prohance)
Comparison: None.

HISTORY: 79 years old female with dementia.
TECHNIQUE: Multiplanar, multisequence MRI images of the brain are obtained prior to and following intravenous contrast.

CONTRAST: 15 mL of ProHance

[Series 1: bSSFP · axial · 8.0mm · 1.17mm/px · 1 of 19 slices shown]
[im 1/19]
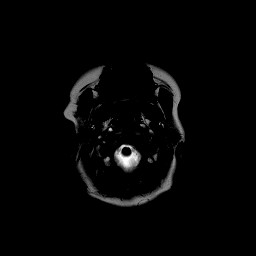

[Series 2: t1_mprage_axial · axial · 1.0mm · 1.00mm/px · z∈[-57,+102]mm · 5 of 160 slices shown]
[im 1/160]
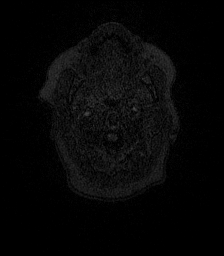
[im 40/160]
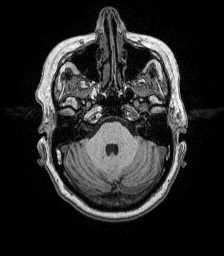
[im 80/160]
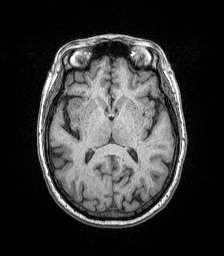
[im 120/160]
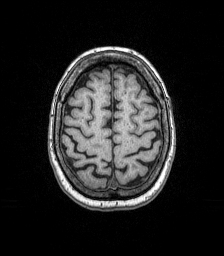
[im 160/160]
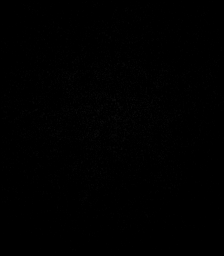

[Series 3: flair_axial_fs · axial · 5.0mm · 0.45mm/px · 1 of 24 slices shown]
[im 1/24]
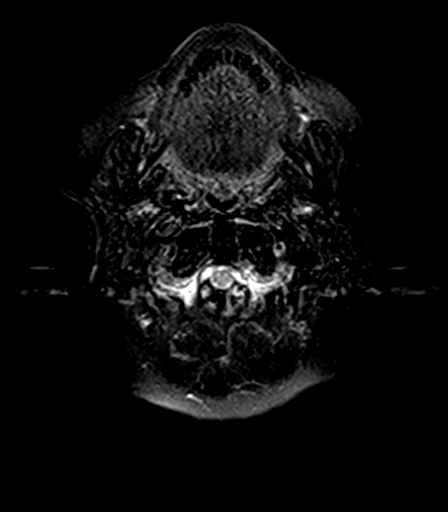

[Series 4: t2_axial · axial · 5.0mm · 0.72mm/px · 1 of 24 slices shown]
[im 1/24]
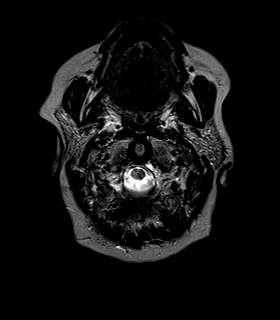

[Series 5: DWI · axial · 5.0mm · 1.26mm/px · 1 of 23 slices shown (1 of 2)]
[im 1/23]
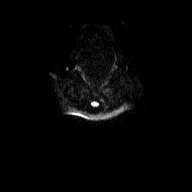

[Series 6: DWI · axial · 5.0mm · 1.26mm/px · 1 of 23 slices shown (2 of 2)]
[im 1/23]
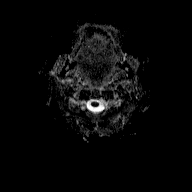

[Series 7: flash_axial · axial · 5.0mm · 0.45mm/px · 1 of 24 slices shown]
[im 1/24]
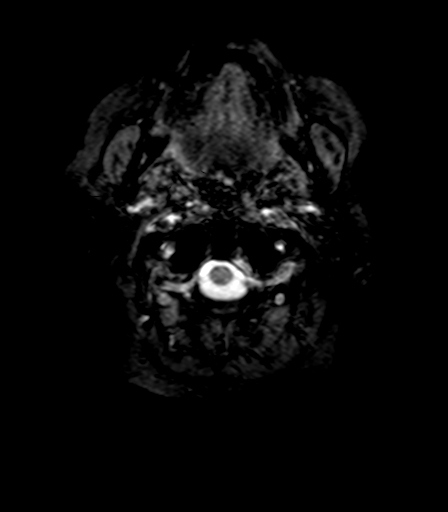

[Series 8: t1_mprage_axial_+c · axial · 1.0mm · 1.00mm/px · z∈[-57,+102]mm · 5 of 160 slices shown]
[im 1/160]
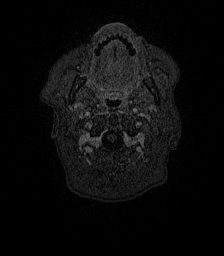
[im 40/160]
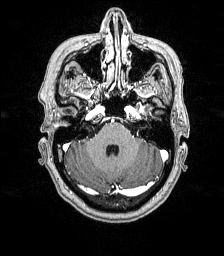
[im 80/160]
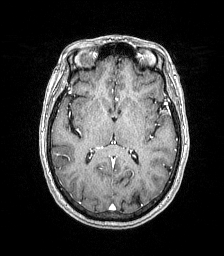
[im 120/160]
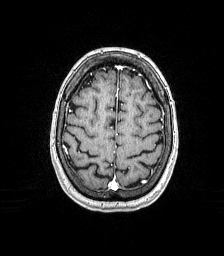
[im 160/160]
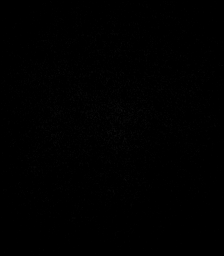

[Series 9: sub_s8-s2_1 · axial · 1.0mm · 1.00mm/px · z∈[-57,+102]mm · 6 of 160 slices shown]
[im 1/160]
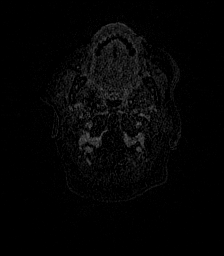
[im 32/160]
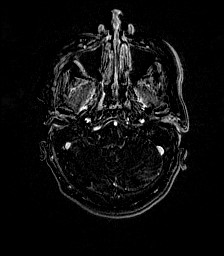
[im 64/160]
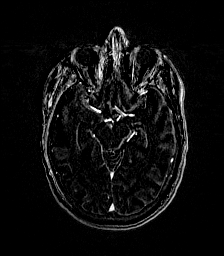
[im 96/160]
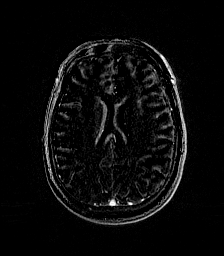
[im 128/160]
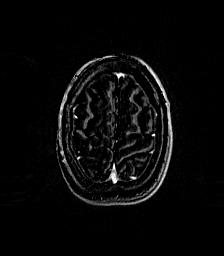
[im 160/160]
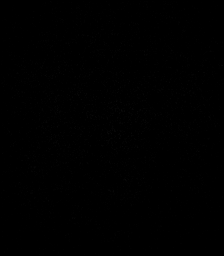

[Series 10: t1_mprage_cor_reformat · coronal · 1.5mm · 1.00mm/px · 7 of 196 slices shown]
[im 1/196]
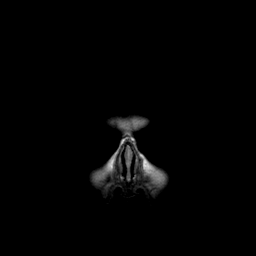
[im 33/196]
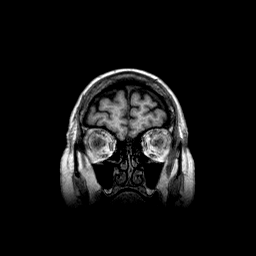
[im 66/196]
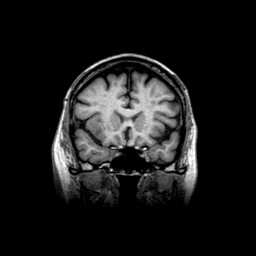
[im 98/196]
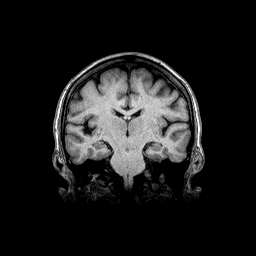
[im 131/196]
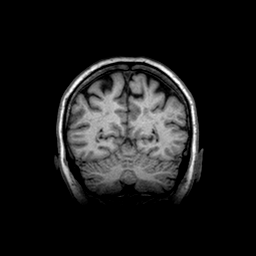
[im 163/196]
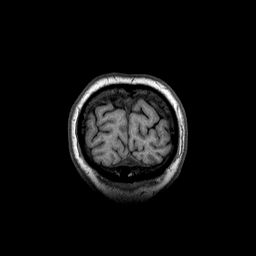
[im 196/196]
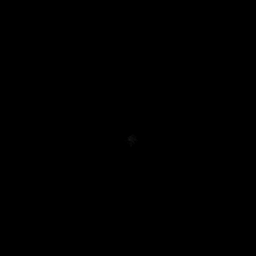

[Series 11: t1_mprage_sag_reformat · sagittal · 1.5mm · 1.00mm/px · 6 of 151 slices shown]
[im 1/151]
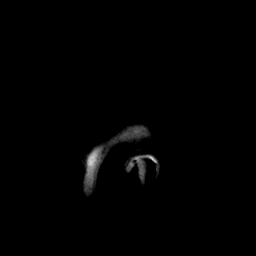
[im 31/151]
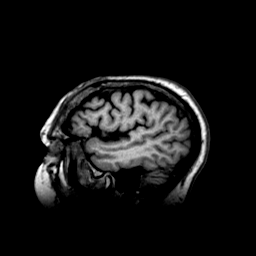
[im 61/151]
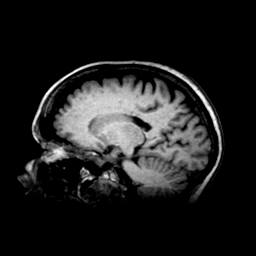
[im 91/151]
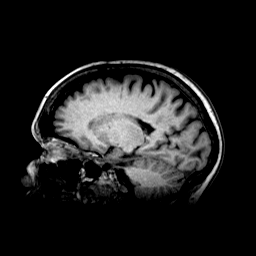
[im 121/151]
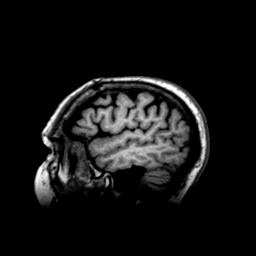
[im 151/151]
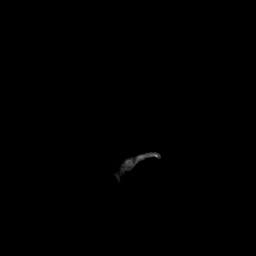

[Series 12: t1_mprage_sag_reformat_+c · sagittal · 1.5mm · 1.00mm/px · 6 of 156 slices shown]
[im 1/156]
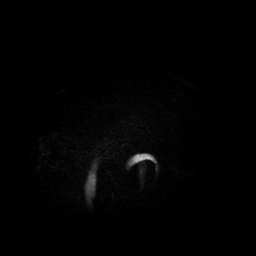
[im 32/156]
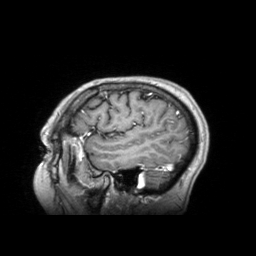
[im 63/156]
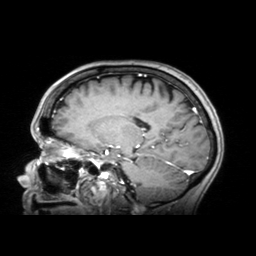
[im 94/156]
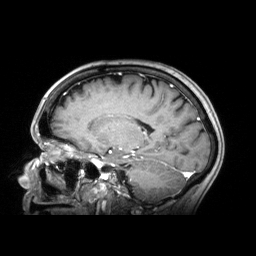
[im 125/156]
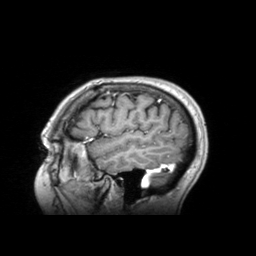
[im 156/156]
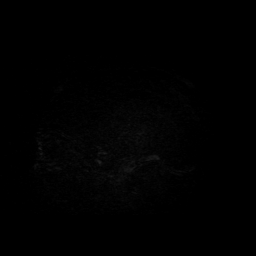

[Series 13: t1_mprage_cor_reformat_+c · coronal · 1.5mm · 1.00mm/px · 7 of 194 slices shown]
[im 1/194]
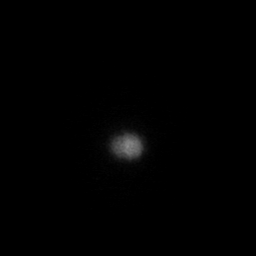
[im 33/194]
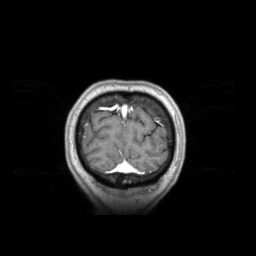
[im 65/194]
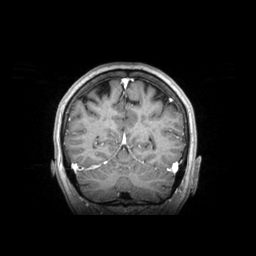
[im 97/194]
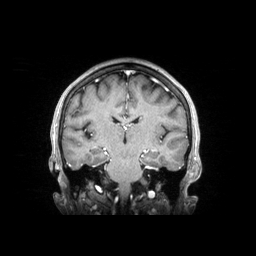
[im 129/194]
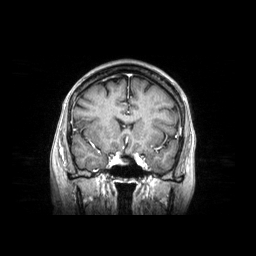
[im 161/194]
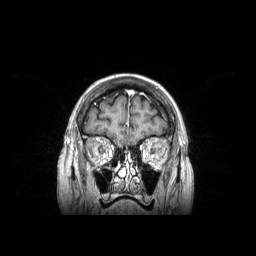
[im 194/194]
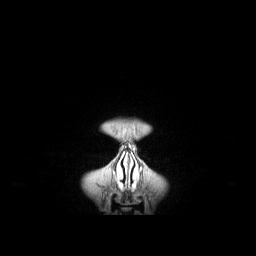

[48 of 48 positions shown; findings below may reference images not displayed]

FINDINGS: BRAIN PARENCHYMA: No acute infarct. Supratentorial white matter T2/FLAIR hyperintensities are nonspecific but statistically most likely to be related to chronic microangiopathic/senescent changes. No abnormal intracranial enhancement.

PITUITARY: Unremarkable.

VASCULATURE: Expected major intracranial flow voids are present.

VENTRICULAR SYSTEM: No hydrocephalus or midline shift.

EXTRA-AXIAL SPACES: No intra- or extra-axial fluid collections.

ORBITS: Unremarkable.

PARANASAL SINUSES AND MASTOID AIR CELLS: Predominantly clear.

BONES: Unremarkable.
IMPRESSION: No acute intracranial abnormality or dementia specific findings. Findings suggestive of chronic microangiopathic changes.

## 2020-05-31 IMAGING — CR HEEL LT 2 VWS
2 series · 2 of 2 positions shown · non-contrast
Comparison: None

Left heel 2 views

INDICATIONS:  Left heel pain

[x calcaneus axial left]
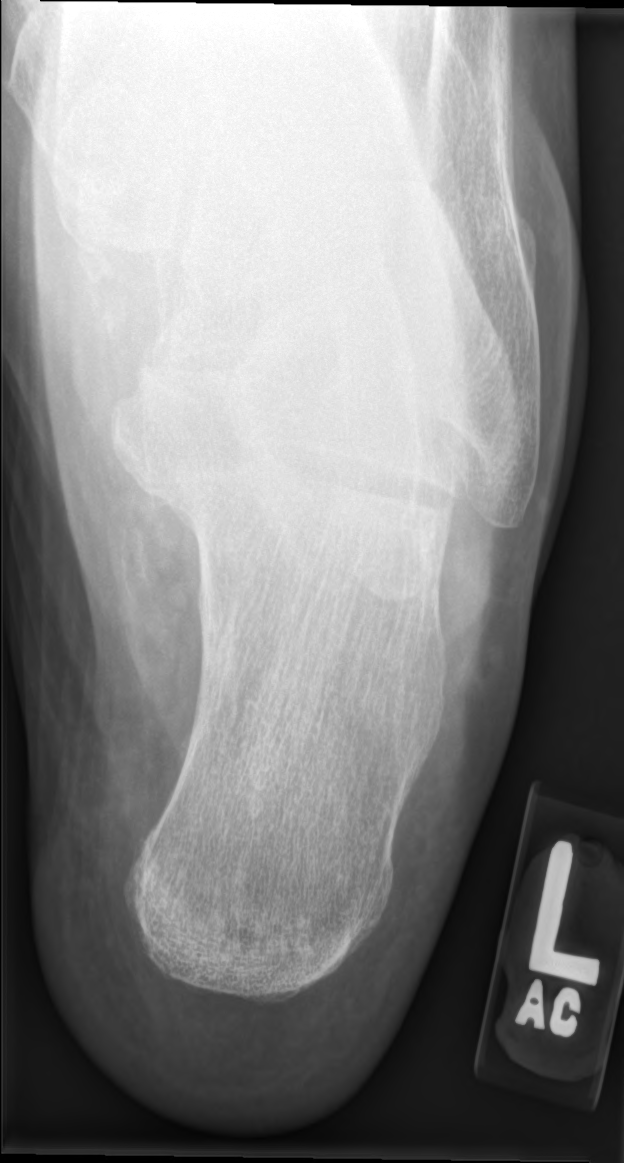

[x calcaneus lat left]
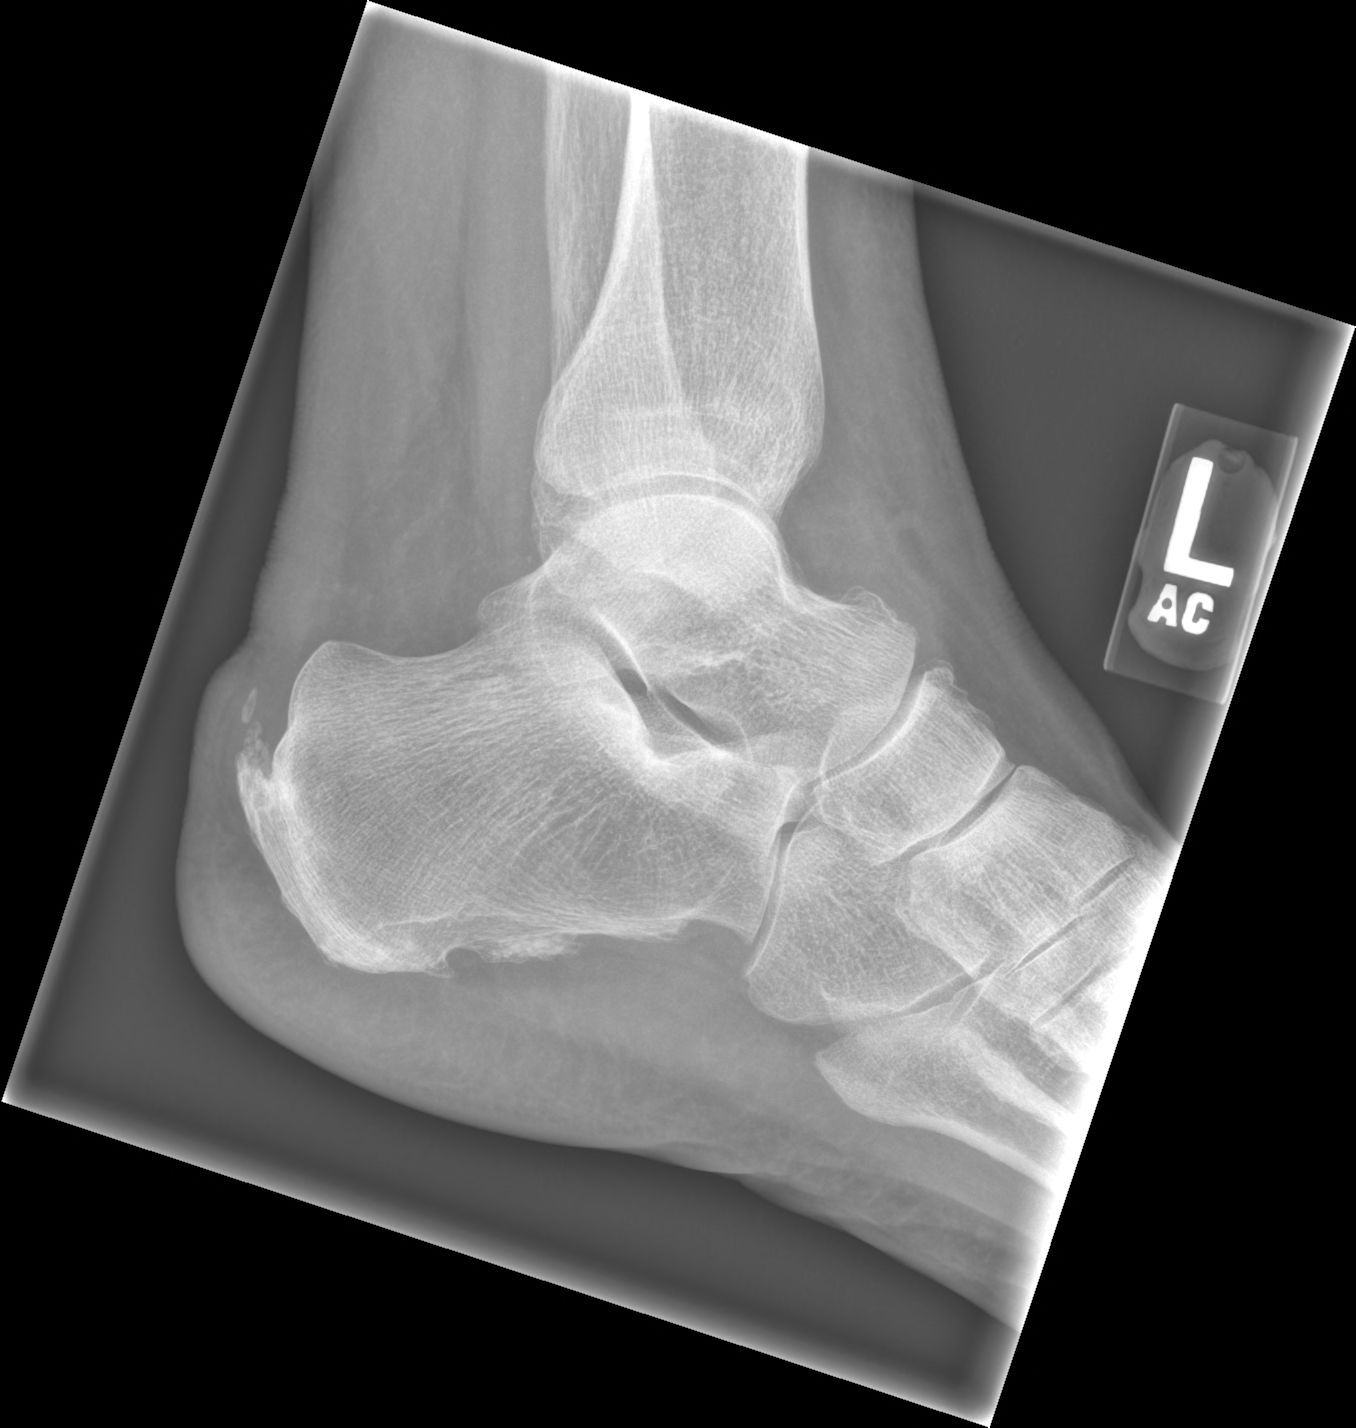

[2 of 2 positions shown; findings below may reference images not displayed]

FINDINGS: Moderate Achilles and small plantar calcaneal spurs. There are some small areas of calcification/ossification and some soft tissue thickening at the Achilles insertion.

I see no calcification of the plantar aponeurosis.

Overall mineralization looks normal. No destructive process.
IMPRESSION: Chronic changes per above.

## 2020-07-02 IMAGING — US US RENAL RETRO COMPLETE
1 series · 14 of 25 positions shown · non-contrast
Comparison: None.

INDICATION: 79-year-old Female with urinary tract infection.
TECHNIQUE: Renal ultrasound retroperitoneum complete. Using real-time and Doppler ultrasound, the retroperitoneum and kidneys were evaluated.

[Series 1: us renal retro complete · 14 of 32 slices shown]
[im 1/32]
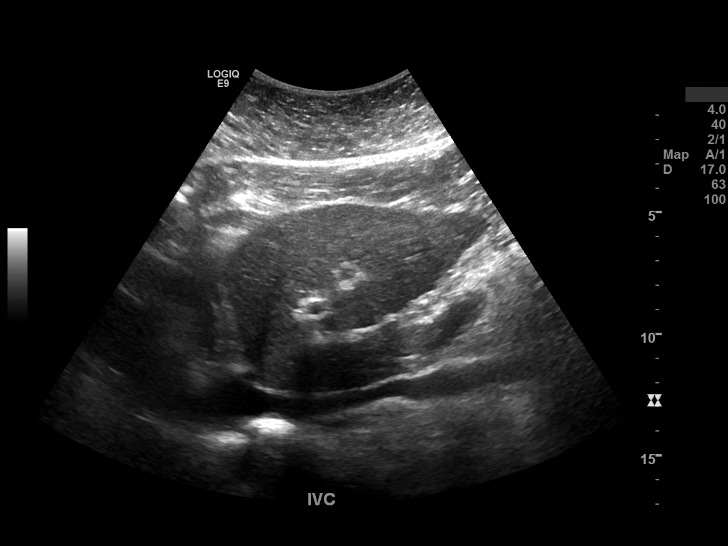
[im 3/32]
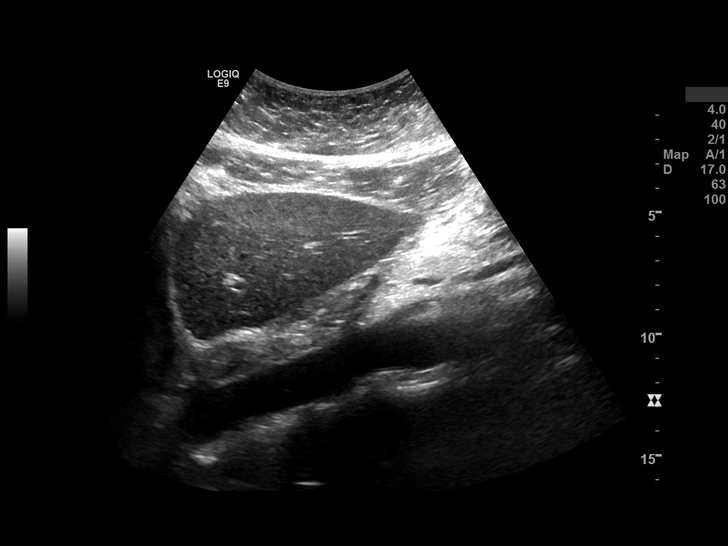
[im 6/32]
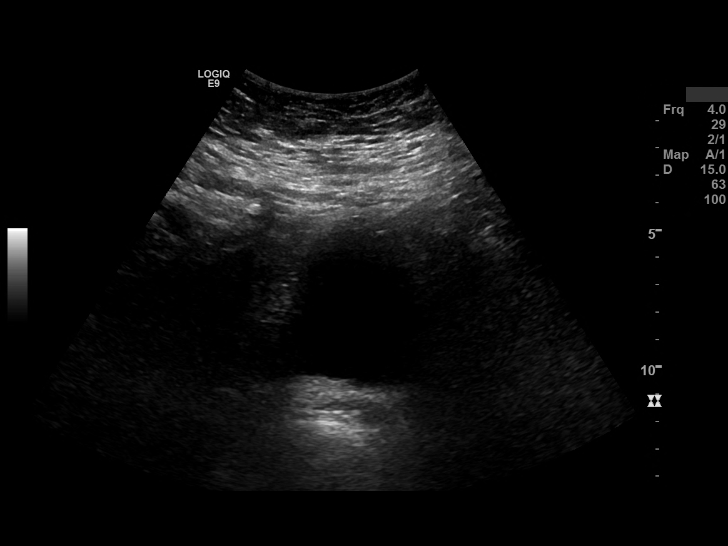
[im 8/32]
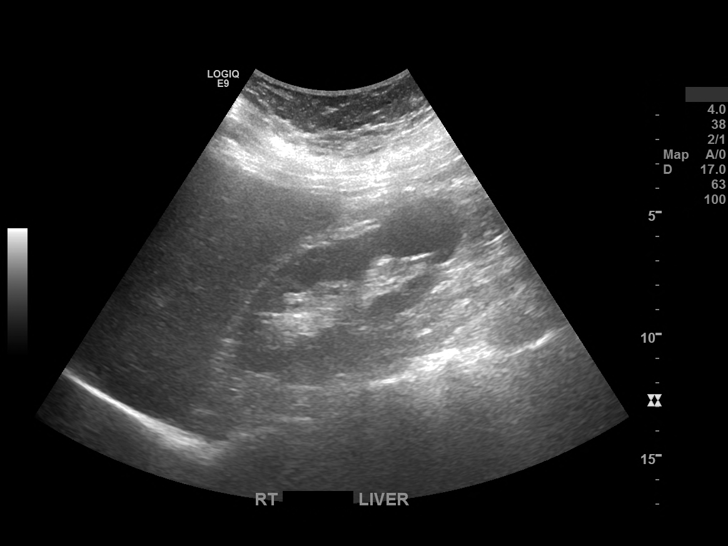
[im 11/32]
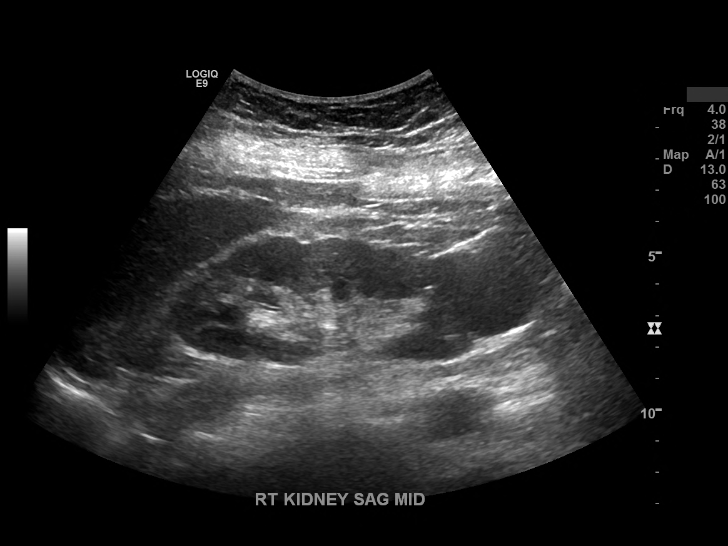
[im 12/32]
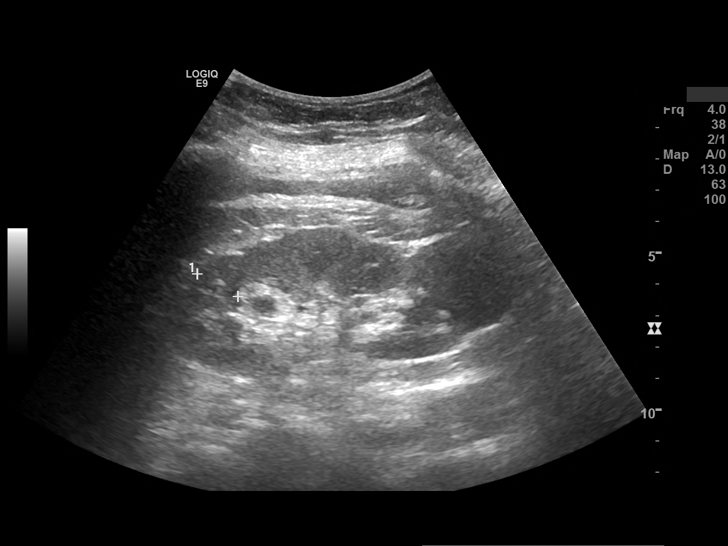
[im 15/32]
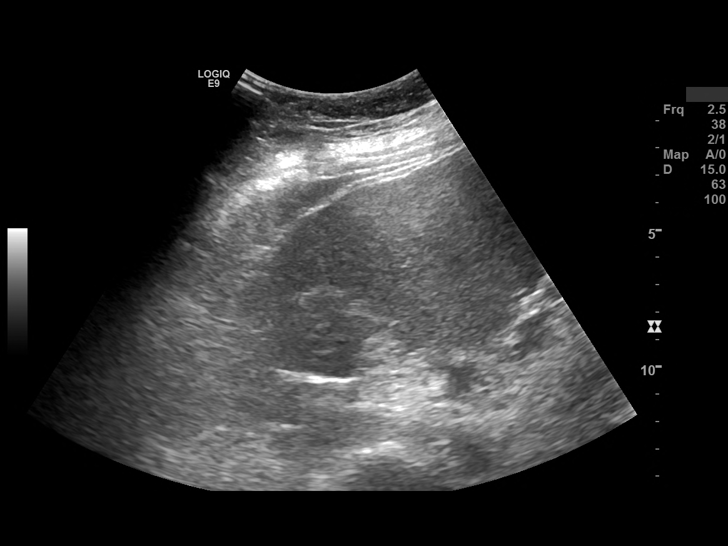
[im 17/32]
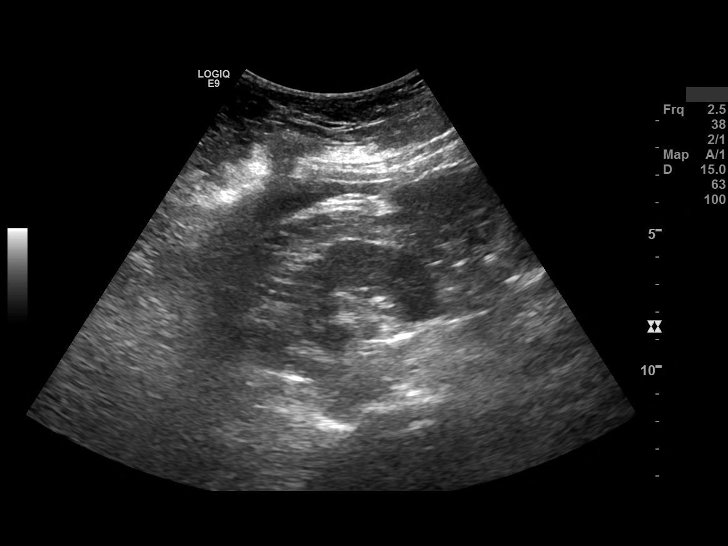
[im 20/32]
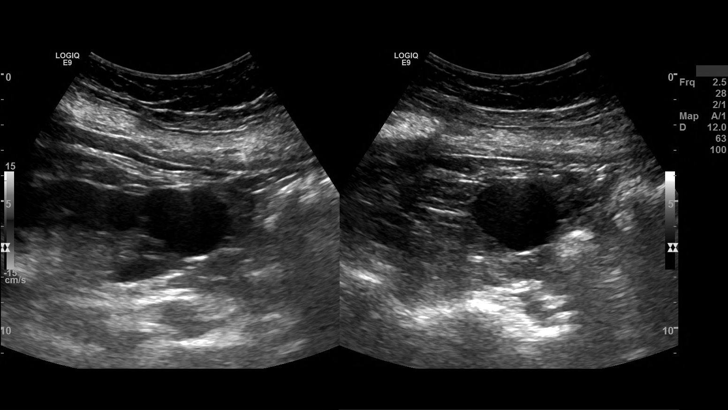
[im 21/32]
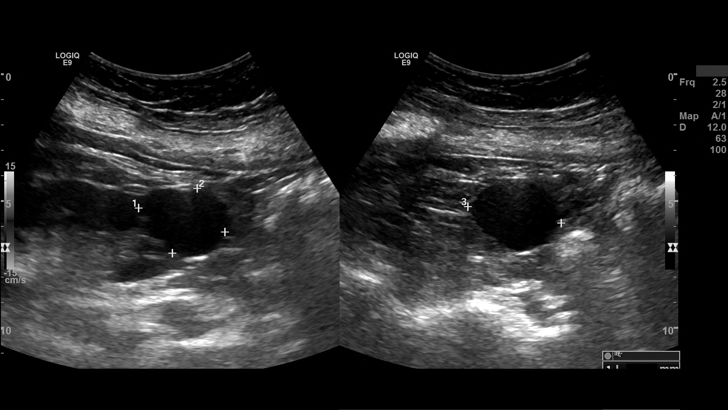
[im 24/32]
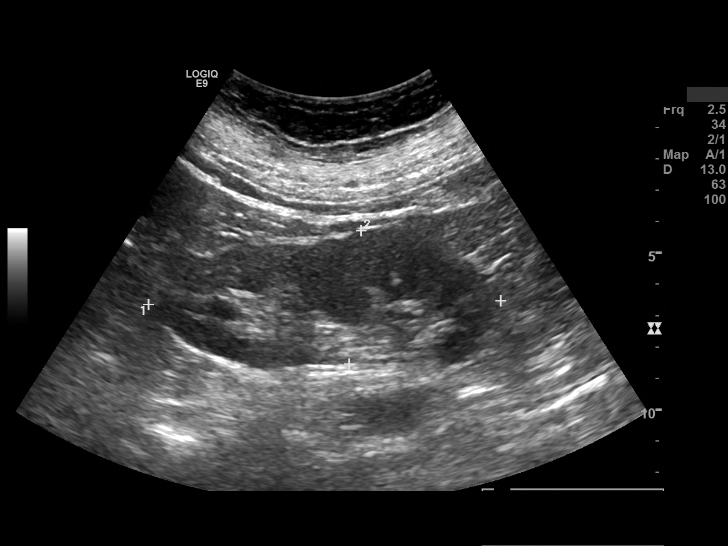
[im 26/32]
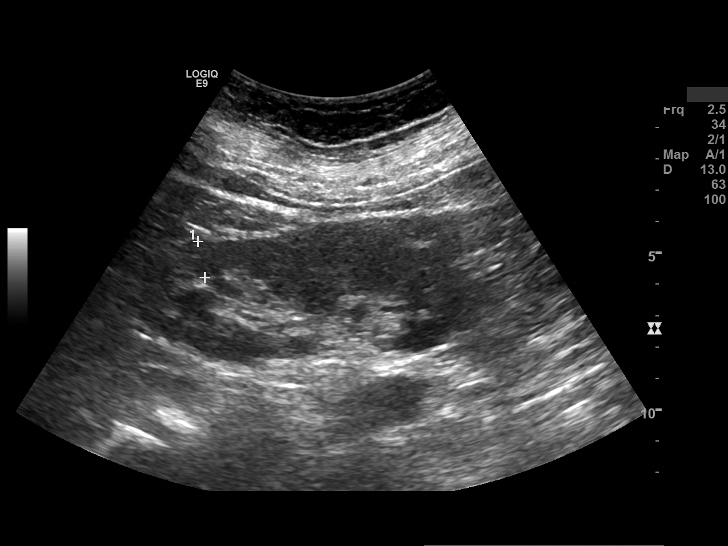
[im 29/32]
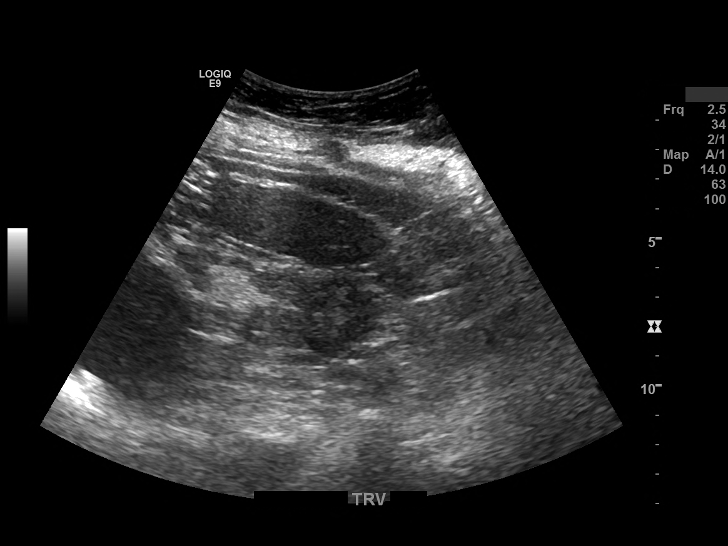
[im 32/32]
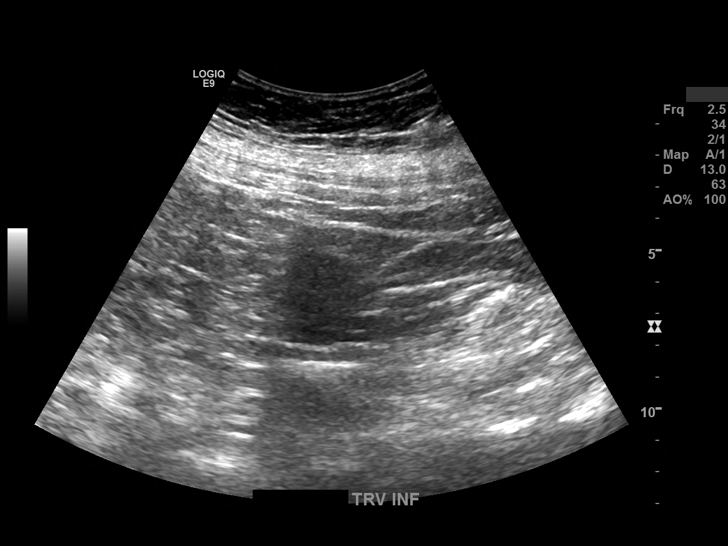

[14 of 25 positions shown; findings below may reference images not displayed]

FINDINGS: The right kidney measures 99 x 38 x 52 mm. The right renal cortex measures 15 mm. The right kidney volume measures 101.1 ml.

The left kidney measures 112 x 43 x 45 mm. The left renal cortex measures 12 mm. The left kidney volume measures 113.9 ml.

Renal cyst: There is a cyst in the lower pole of the right kidney measuring 35 x 27 x 37 mm.

Renal stone: None.

The bladder is unremarkable. 

Abdominal aorta: The visualized portion of the abdominal aorta is unremarkable.

Inferior vena cava: The visualized portion of the inferior vena cava is unremarkable.

Common iliac arteries/Bifurcation: The visualized portion is unremarkable
IMPRESSION: Cyst in the lower pole of the right kidney measuring 35 x 27 x 37 mm. Otherwise, normal kidneys, urinary bladder and retroperitoneum.

## 2020-10-21 IMAGING — RF UGI AIR W/KUB
11 of 13 series · 15 of 18 positions shown · non-contrast
Comparison: none

INDICATION: Esophageal obstruction. Reflux
TECHNIQUE: Air contrast upper GI performed with barium and air.

RADIATION DOSE:
Fluoroscopic images: 37 
Fluoroscopy time: 1.4 minutes
Dose area product: 603.21  microGy*m2
Reference air kerma: 19.40 mGy

[Series 1: fluoro_barium_bariatric 2fps_bw · 0.17mm/px · 2 of 13 frames shown (1 of 11)]
[frame 2/13]
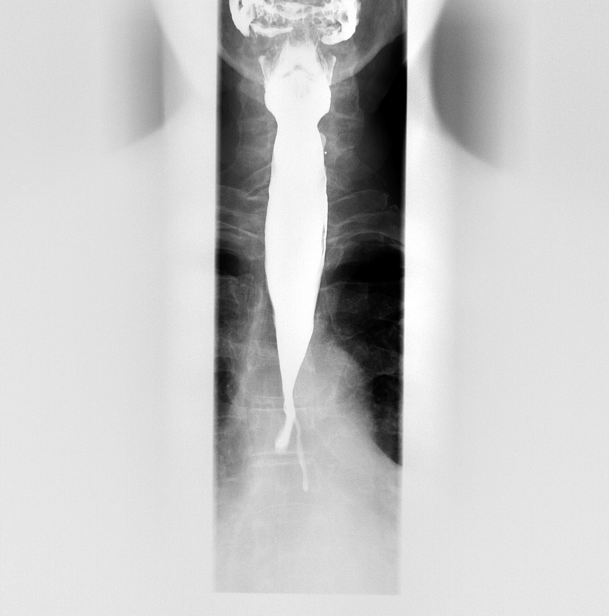
[frame 7/13]
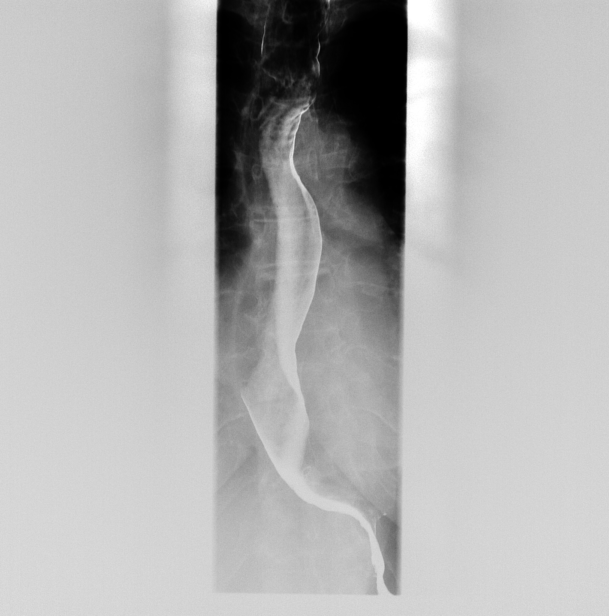

[Series 2: fluoro_barium_bariatric 2fps_bw · 0.17mm/px · 4 of 13 frames shown (2 of 11)]
[frame 2/13]
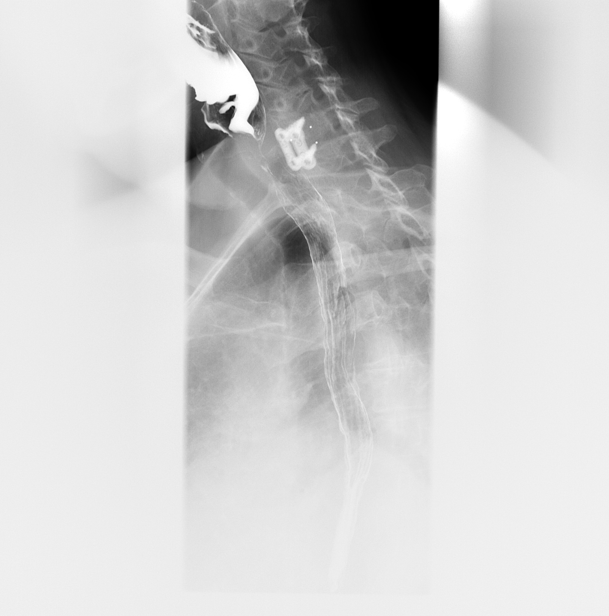
[frame 7/13]
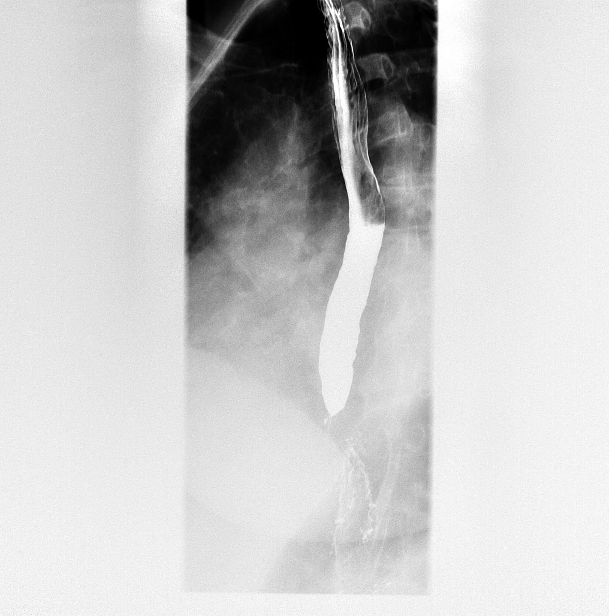
[frame 12/13]
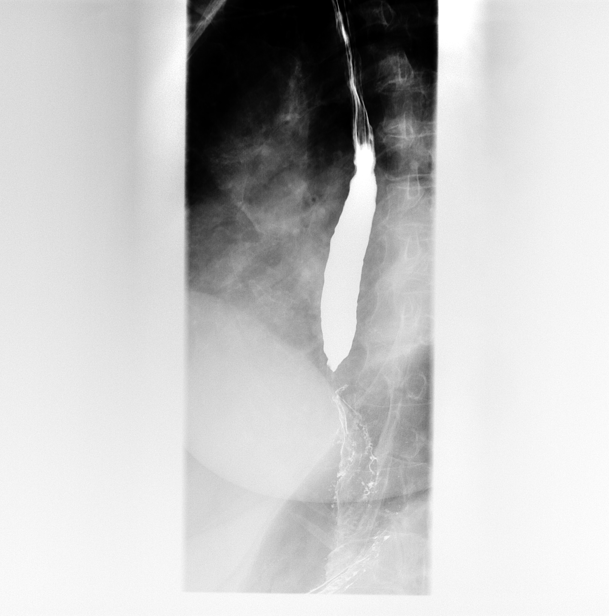
[frame 13/13]
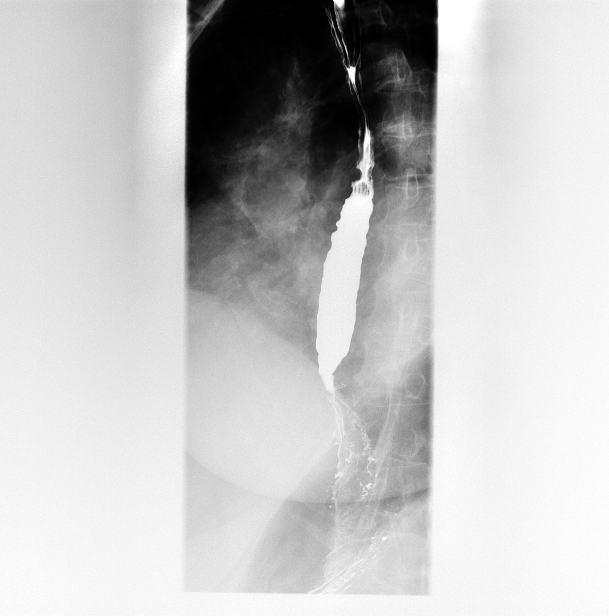

[Series 3: fluoro_barium_bariatric 2fps_bw · 0.17mm/px · 1 of 1 slices shown (3 of 11)]
[im 1/1]
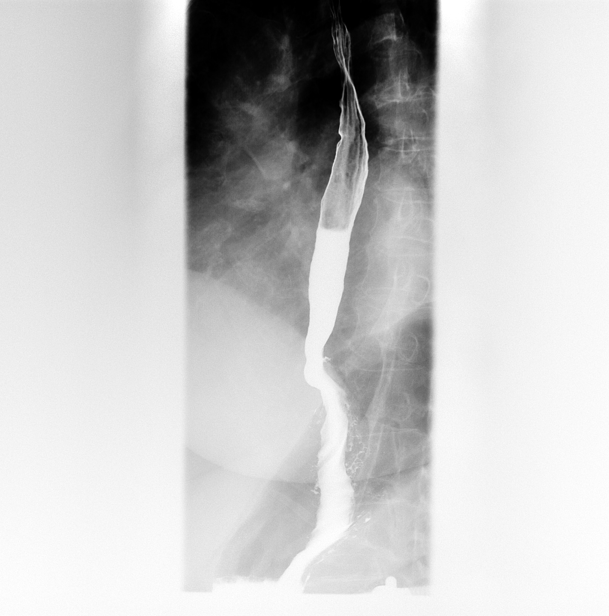

[Series 5: fluoro_barium_bariatric 2fps_bw · 0.19mm/px · 1 of 1 slices shown (4 of 11)]
[im 1/1]
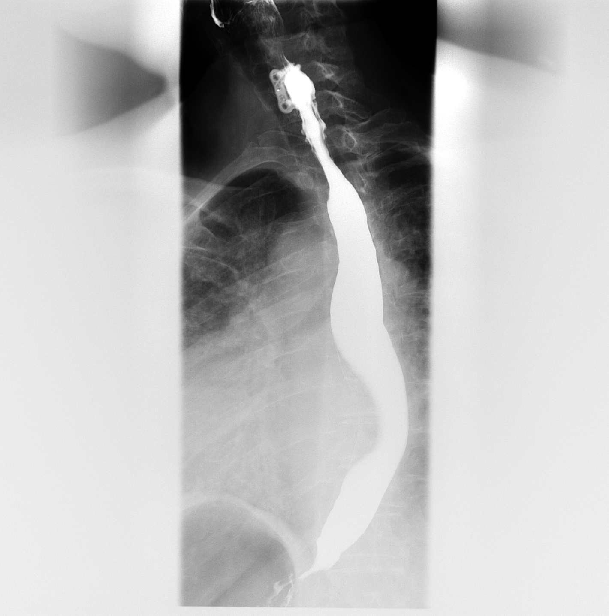

[Series 6: fluoro_barium_bariatric 2fps_bw · 0.19mm/px · 1 of 1 slices shown (5 of 11)]
[im 1/1]
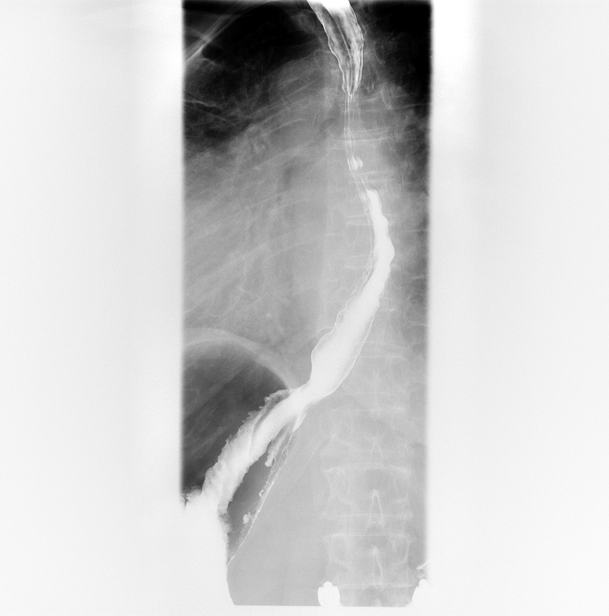

[Series 7: fluoro_barium_bariatric 2fps_bw · 0.19mm/px · 1 of 1 slices shown (6 of 11)]
[im 1/1]
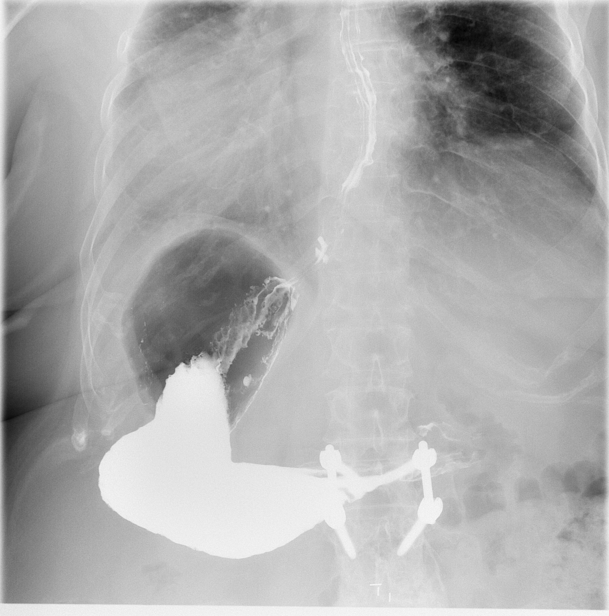

[Series 8: fluoro_barium_bariatric 2fps_bw · 0.17mm/px · 1 of 1 slices shown (7 of 11)]
[im 1/1]
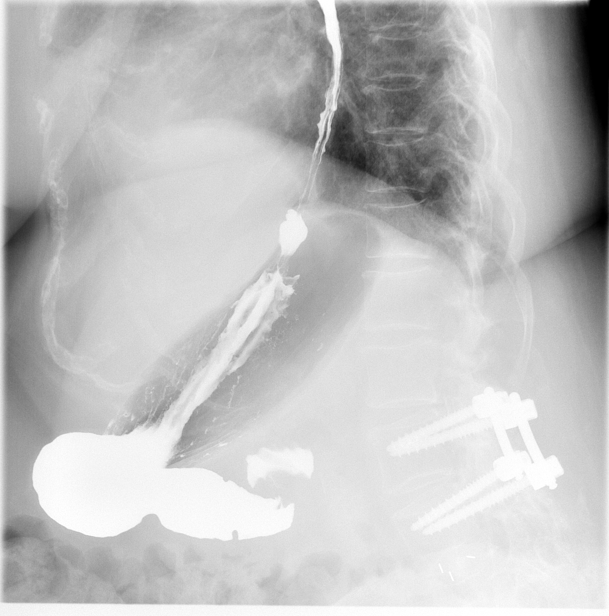

[Series 9: fluoro_barium_bariatric 2fps_bw · 0.17mm/px · 1 of 1 slices shown (8 of 11)]
[im 1/1]
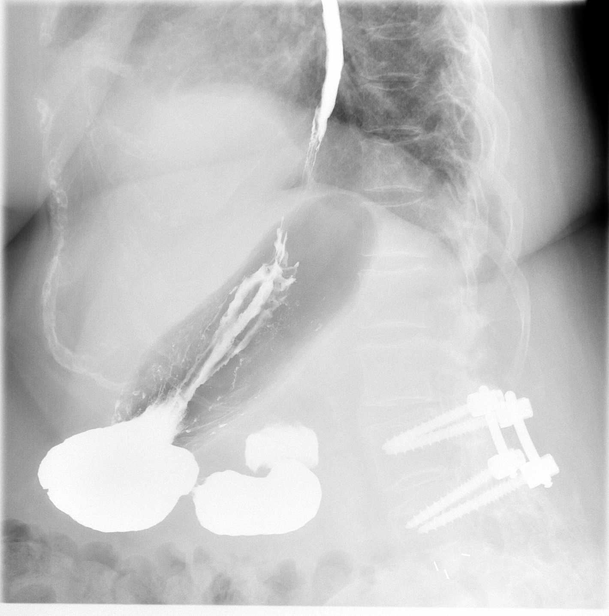

[Series 11: fluoro_barium_bariatric 2fps_bw · 0.17mm/px · 1 of 1 slices shown (9 of 11)]
[im 1/1]
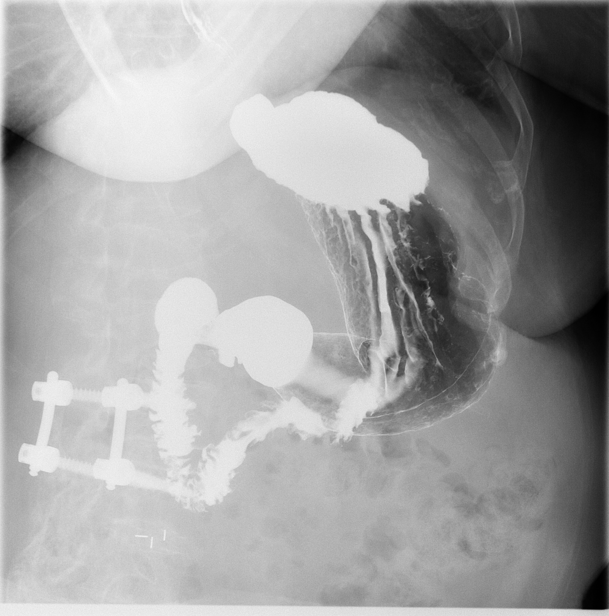

[Series 12: fluoro_barium_bariatric 2fps_bw · 0.18mm/px · 1 of 1 slices shown (10 of 11)]
[im 1/1]
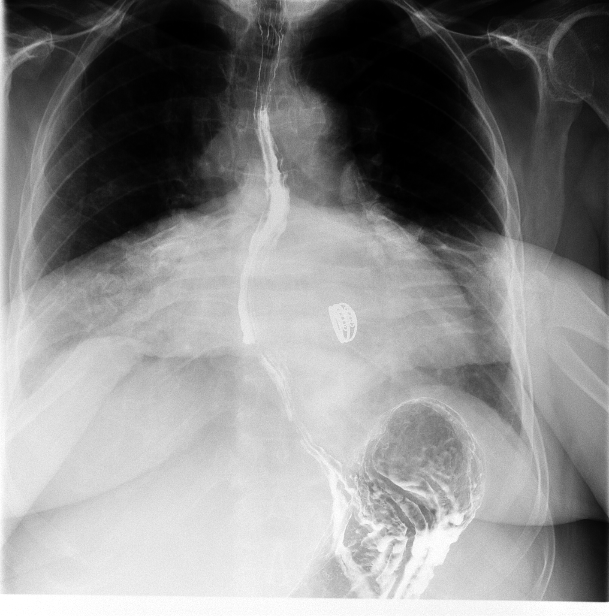

[Series 13: fluoro_barium_bariatric 2fps_bw · 0.19mm/px · 1 of 1 slices shown (11 of 11)]
[im 1/1]
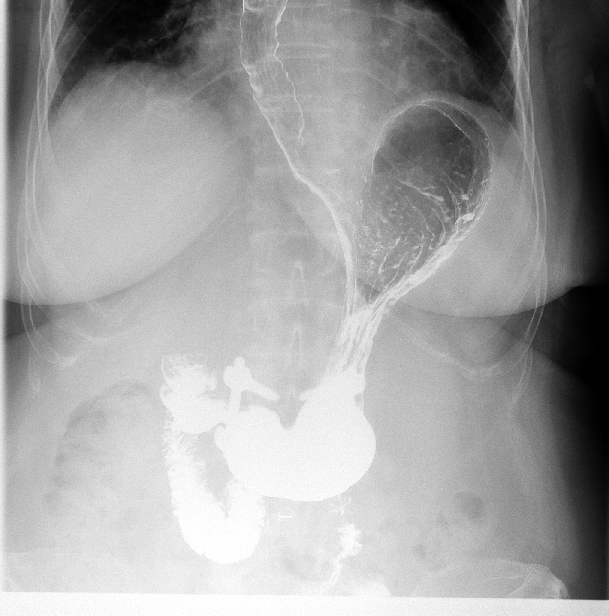

[15 of 18 positions shown; findings below may reference images not displayed]

FINDINGS: The scout film is unremarkable. 

Barium and air examination of the upper GI shows the esophagus to be normal in caliber. I see no stricture nor sign of obstruction. No achalasia. The caliber is normal.

In the flat supine position, there was moderate reflux up to above the level of carina.

The stomach, duodenal bulb, and loop are unremarkable.
IMPRESSION: Moderate reflux.

## 2021-10-30 IMAGING — CR L-SPINE 4 VWS MIN
5 series · 5 of 5 positions shown · non-contrast
Comparison: January 15, 2020
TECHNIQUE AND FINDINGS:
Five  views of the lumbar spine were obtained.

Images Obtained from [facility]
REASON FOR EXAM: Low back pain with sciatica

[t lumbar spine ap]
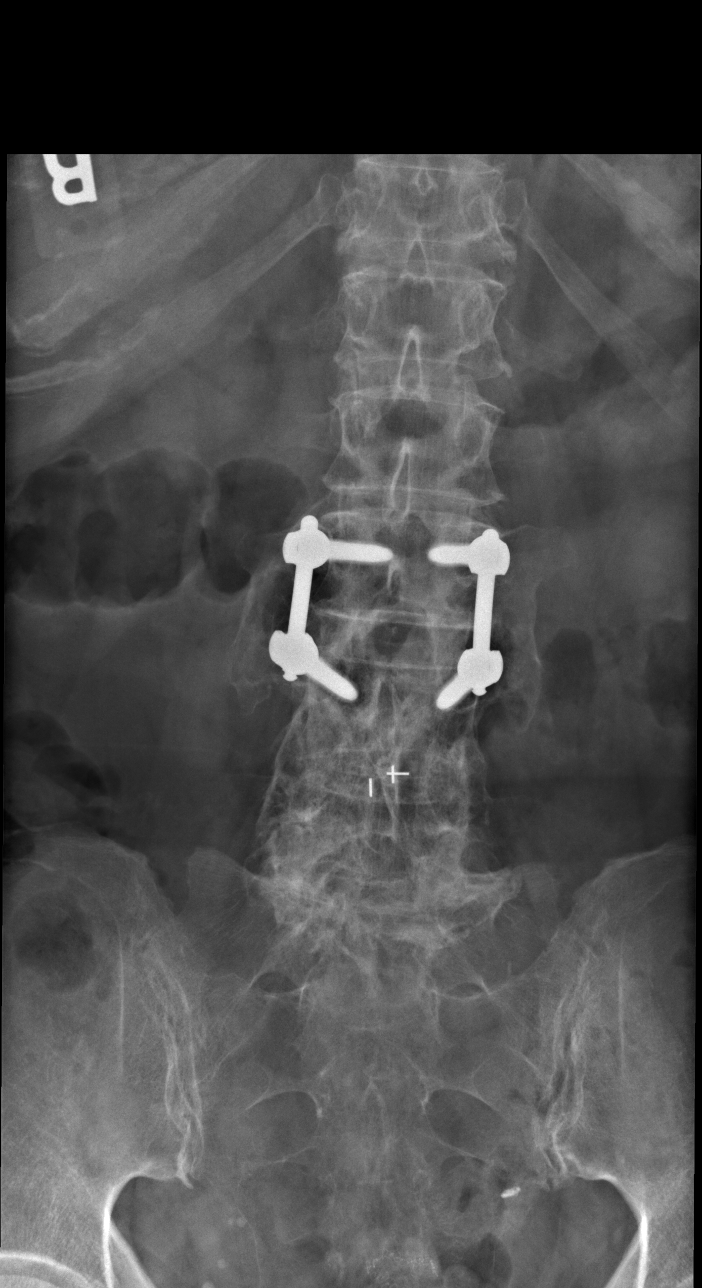

[t lumbar spine obl (1 of 2)]
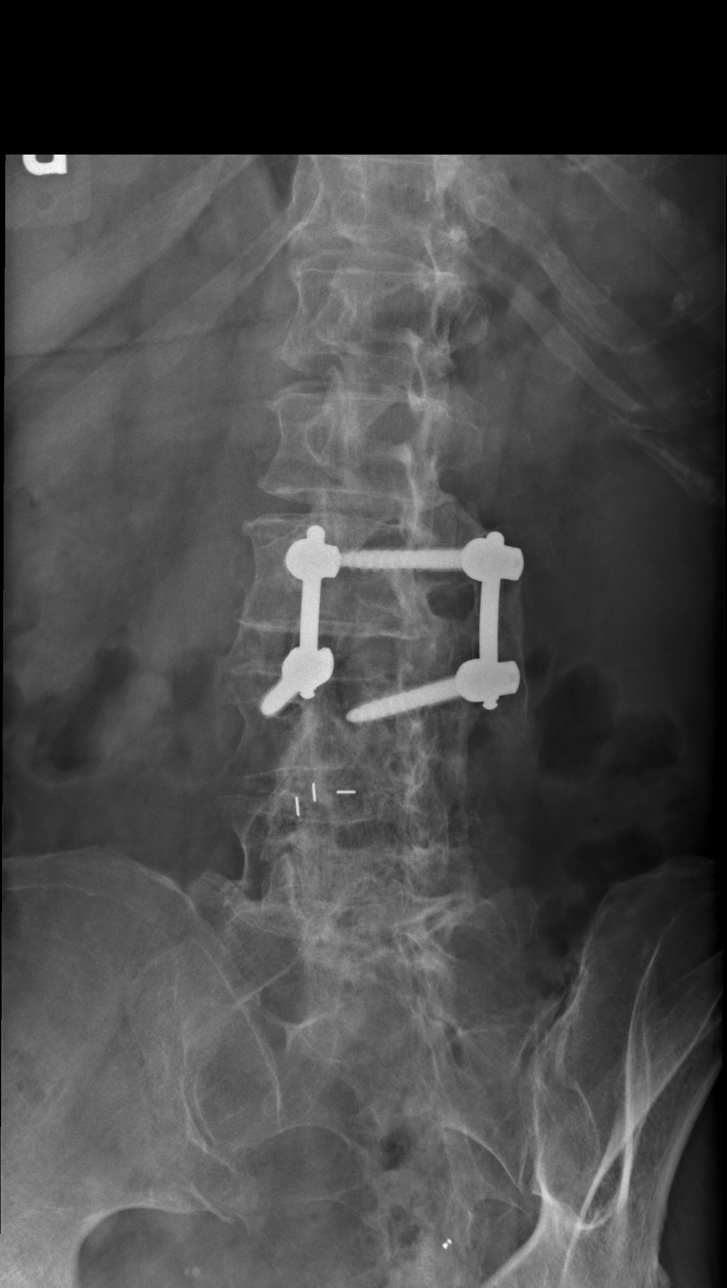

[t lumbar spine obl (2 of 2)]
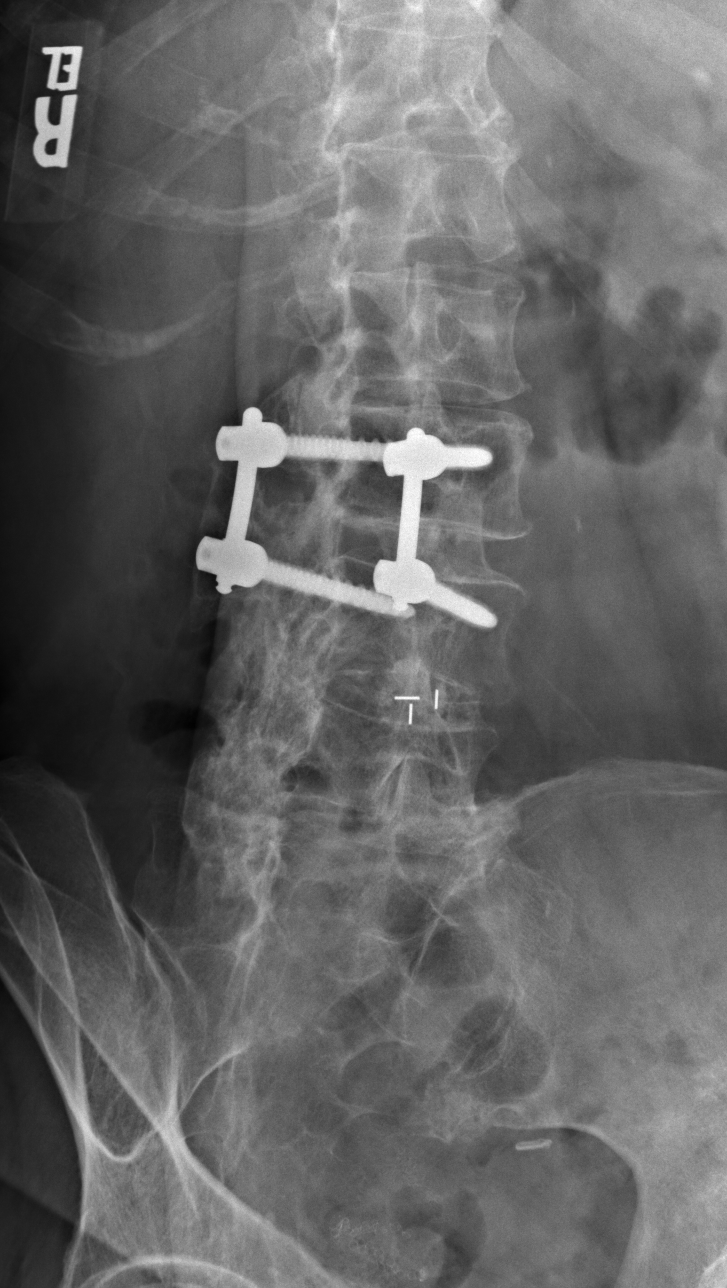

[t lumbar spine lat]
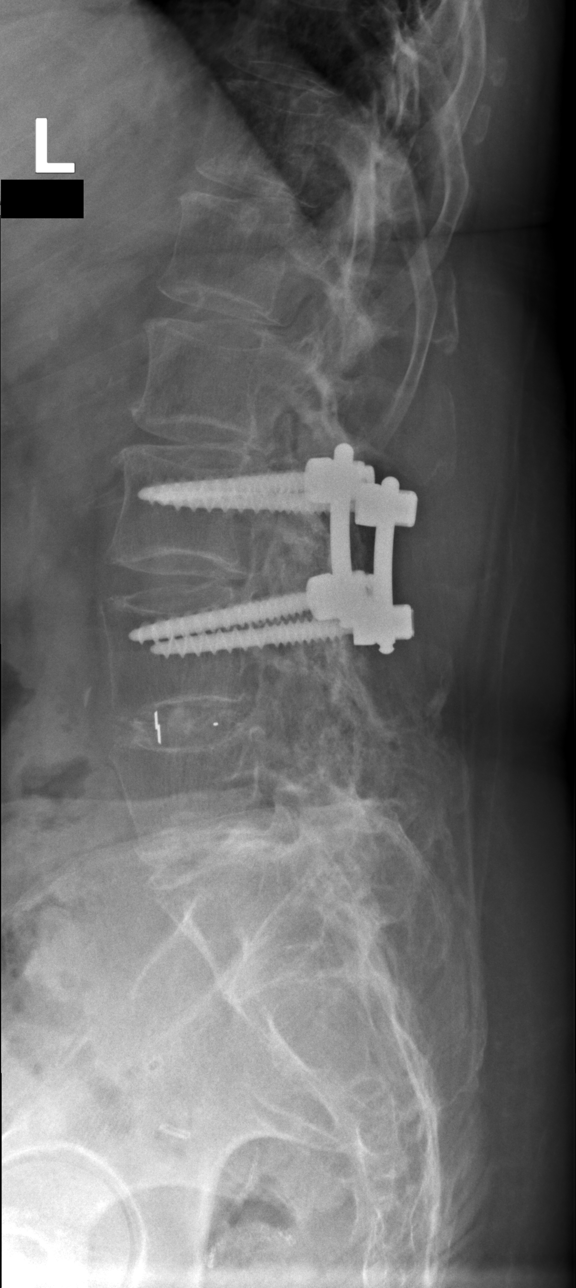

[t lumbar l-5 s-1 spot]
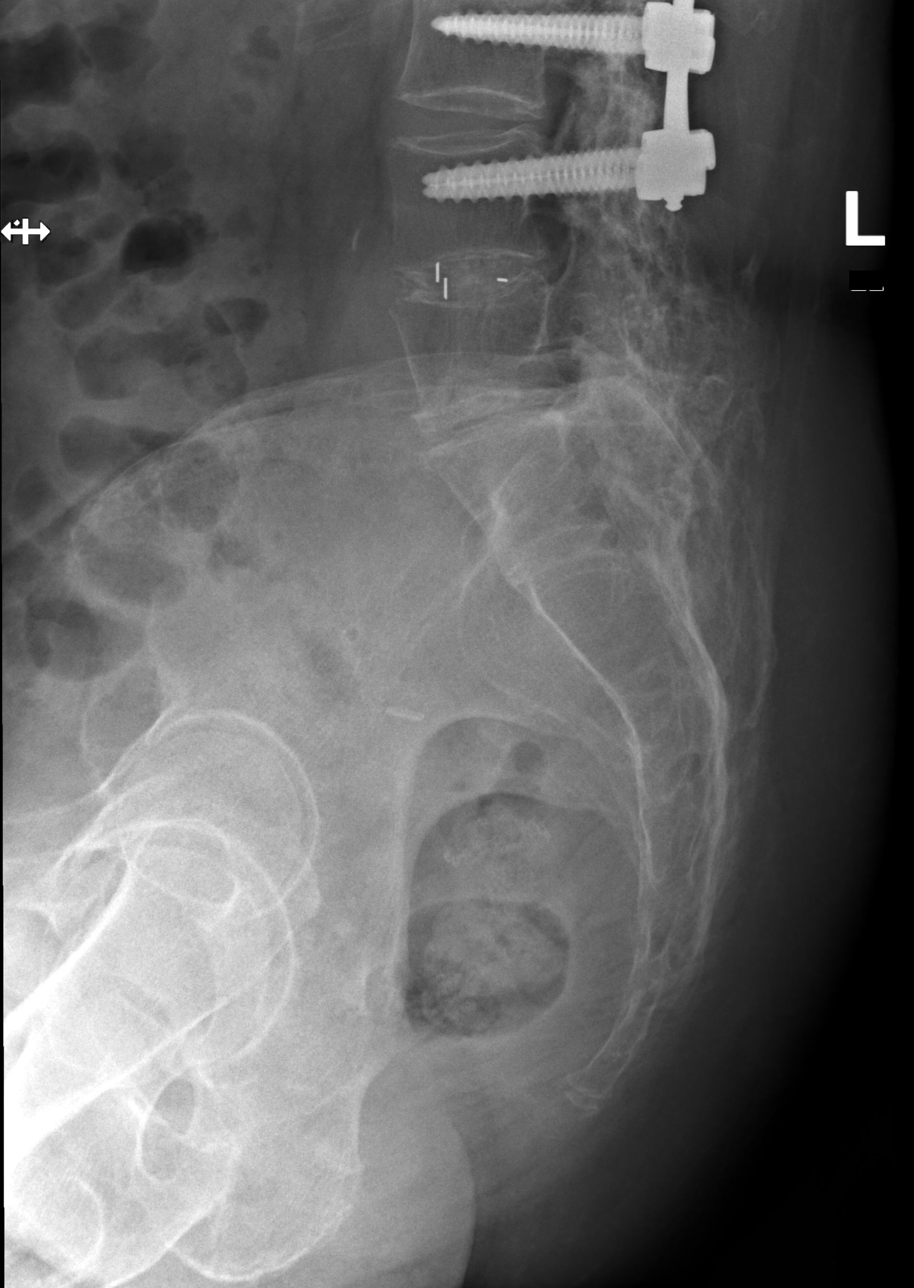

[5 of 5 positions shown; findings below may reference images not displayed]

There are five lumbar type vertebral bodies. The bones are osteopenic. Patient is status post posterior fusion across L3 and L4 with transpedicular
screws and fixation rods. There has also been disc spacer placed between L4-5. The vertebral bodies have normal height and alignment. There is disc space narrowing at L5-S1. Facets are in good
alignment. No pars defects are seen. Soft tissues are unremarkable.
IMPRESSION: 1.  Expected postsurgical changes.
2.  Degenerative disc disease L5-S1.

## 2022-01-16 IMAGING — MR MRI LSPINE WO CONTRAST
6 of 8 series · 29 of 48 positions shown · non-contrast
Comparison: 5 views lumbar spine study dated 10/30/2021.

Images Obtained from [facility]
HISTORY: 81 years-old Female with lumbar radiculopathy, chronic postprocedural pain status post surgery x2, right hip/leg pain.
TECHNIQUE: Multiplanar multisequential MR images were obtained of the lumbar spine without contrast administration.

[Series 1: bSSFP · axial · 8.0mm · 1.37mm/px · z∈[-61,+175]mm · 8 of 25 slices shown]
[im 1/25]
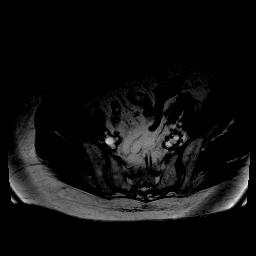
[im 4/25]
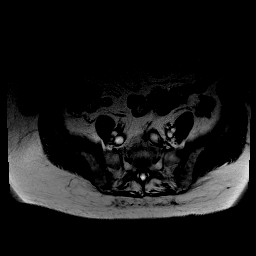
[im 7/25]
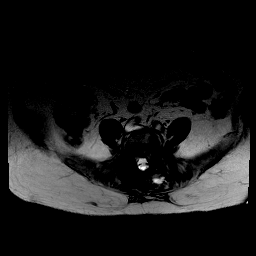
[im 11/25]
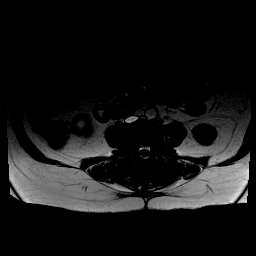
[im 14/25]
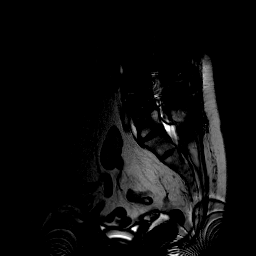
[im 18/25]
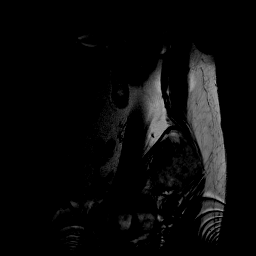
[im 21/25]
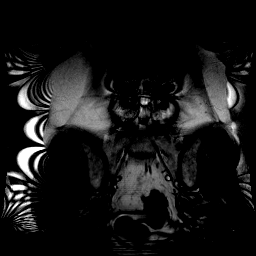
[im 25/25]
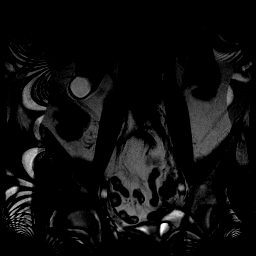

[Series 2: t2_cor_hbw · coronal · 4.0mm · 0.55mm/px · 4 of 15 slices shown]
[im 1/15]
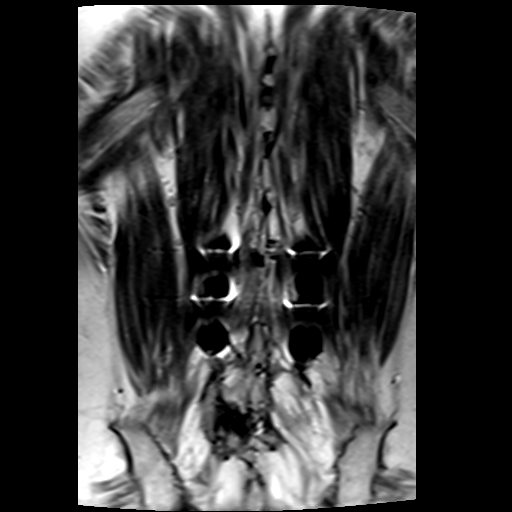
[im 5/15]
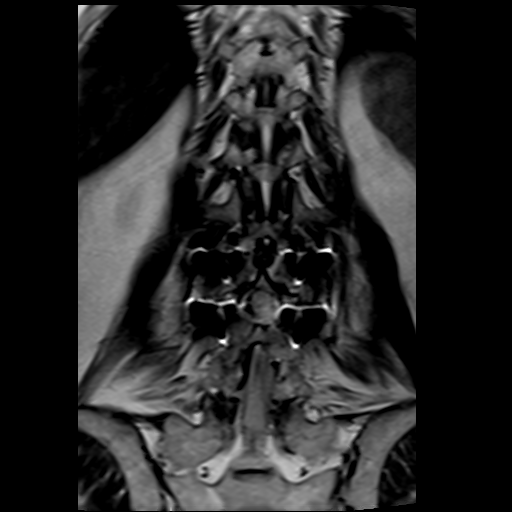
[im 10/15]
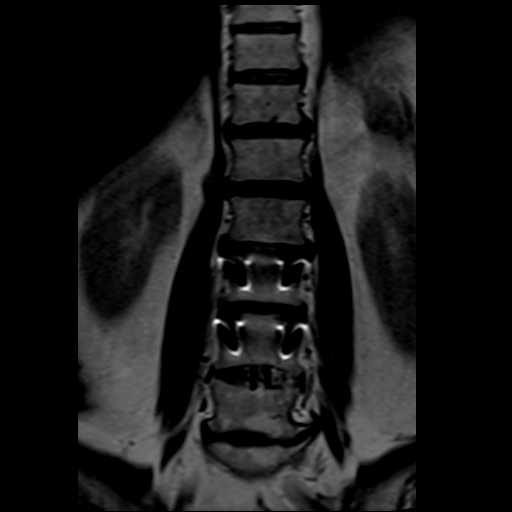
[im 15/15]
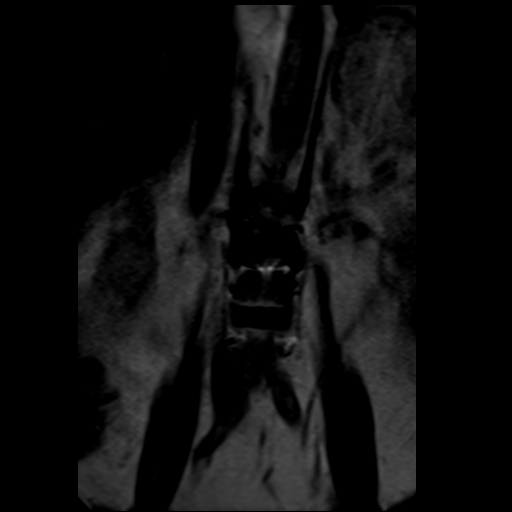

[Series 3: t2_sag_hbw · sagittal · 4.0mm · 0.38mm/px · 4 of 15 slices shown]
[im 1/15]
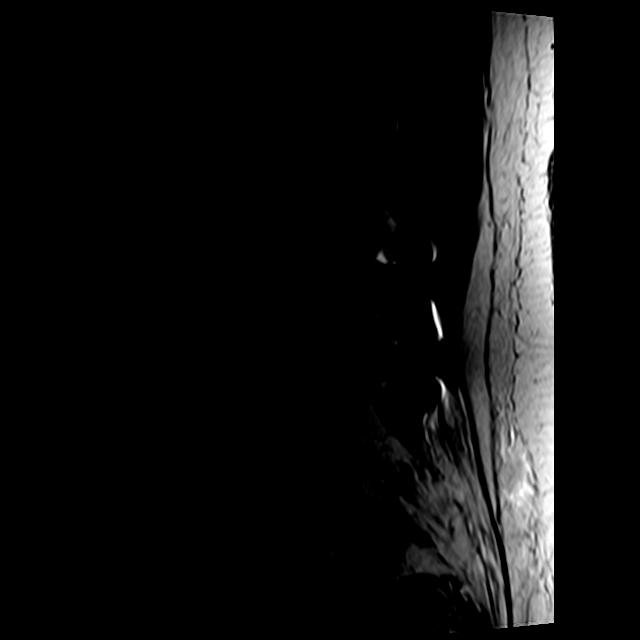
[im 5/15]
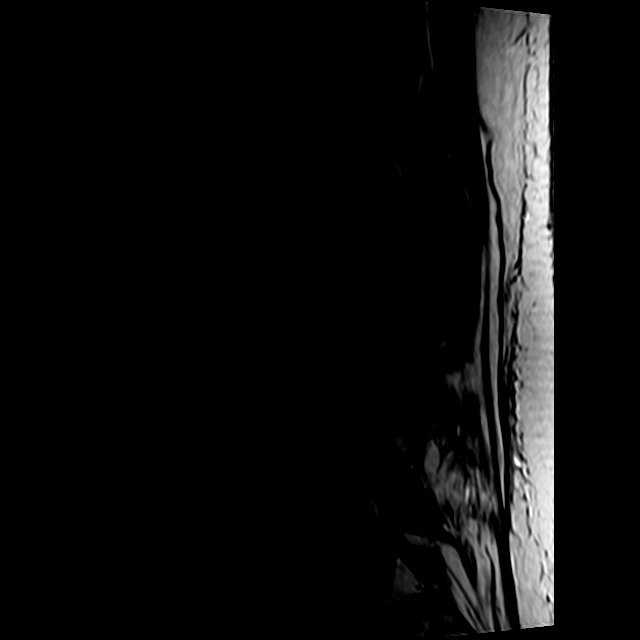
[im 10/15]
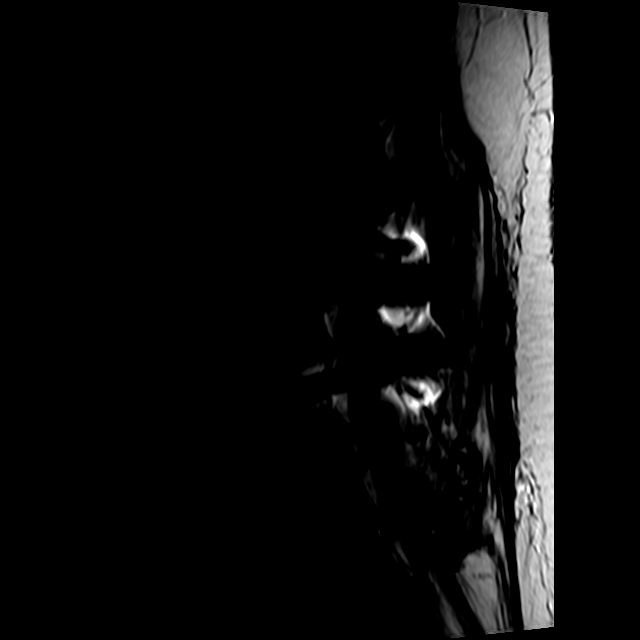
[im 15/15]
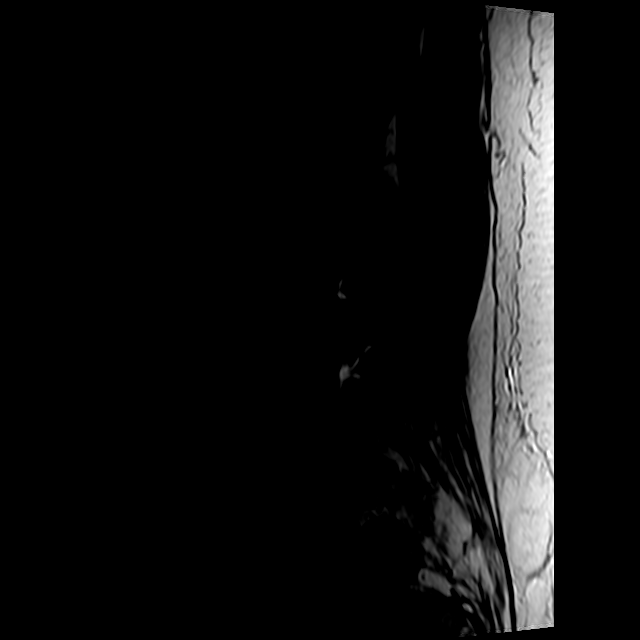

[Series 4: ir_sag_hbw · sagittal · 4.0mm · 0.47mm/px · 4 of 15 slices shown]
[im 1/15]
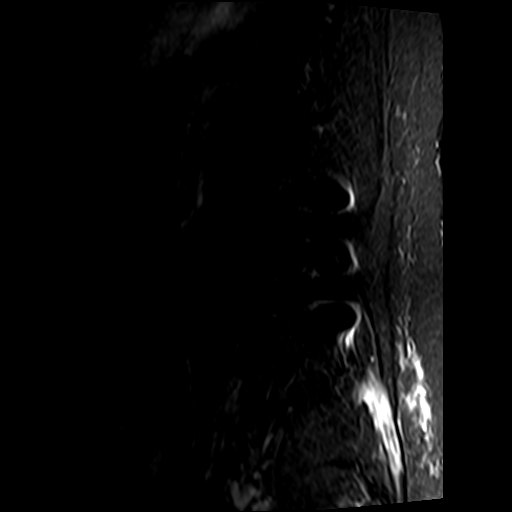
[im 5/15]
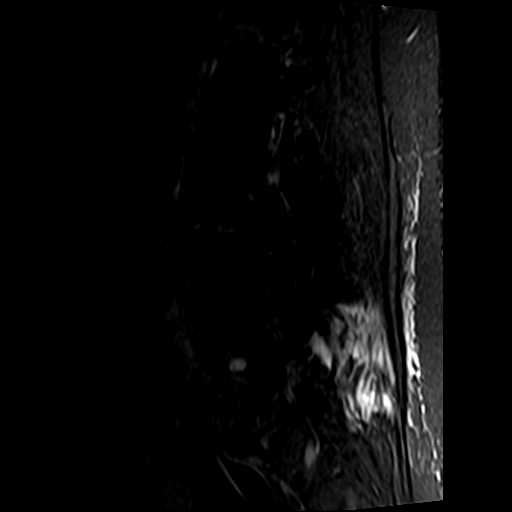
[im 10/15]
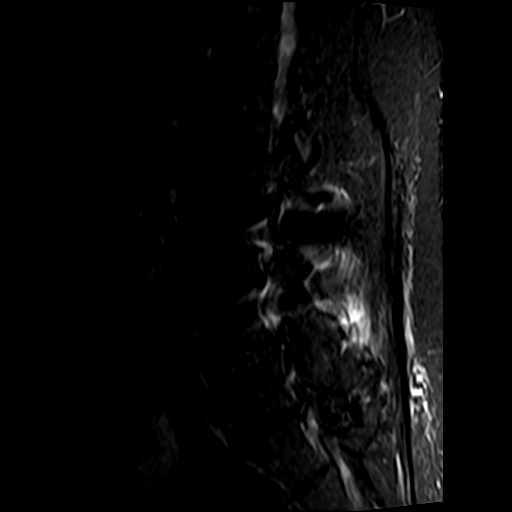
[im 15/15]
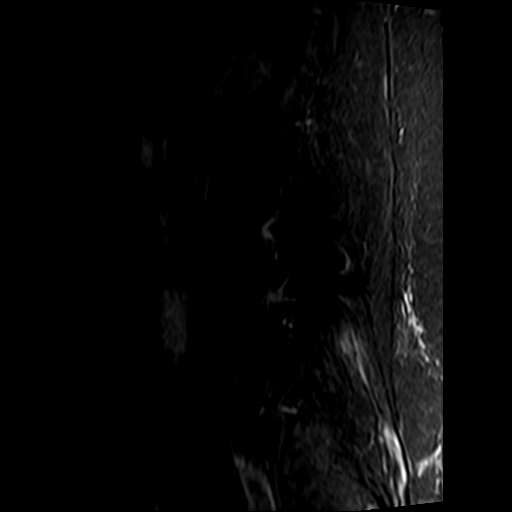

[Series 5: t1_sag_hbw · sagittal · 4.0mm · 0.94mm/px · 4 of 15 slices shown]
[im 1/15]
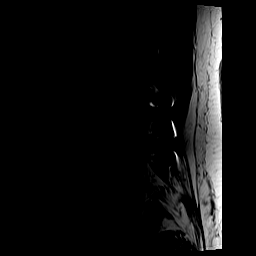
[im 5/15]
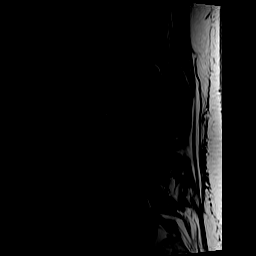
[im 10/15]
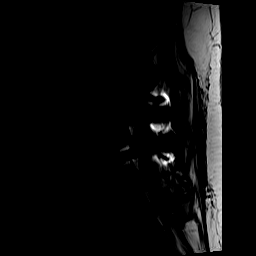
[im 15/15]
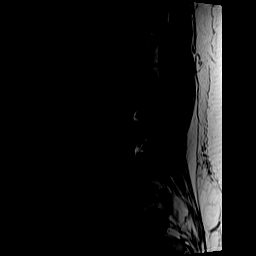

[Series 6: t1_axial_obl_hbw · axial · 4.0mm · 0.86mm/px · z∈[-53,+92]mm · 5 of 25 slices shown]
[im 1/25]
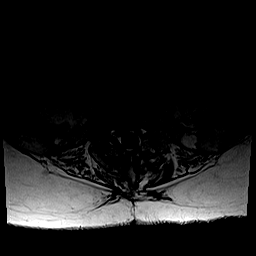
[im 5/25]
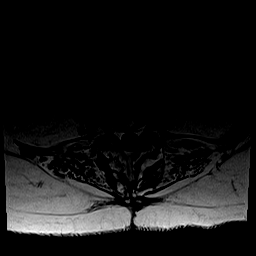
[im 9/25]
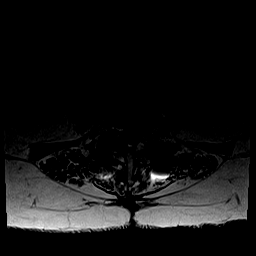
[im 13/25]
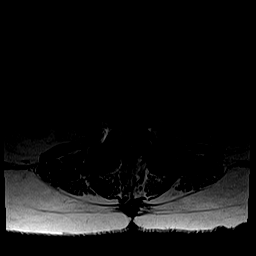
[im 17/25]
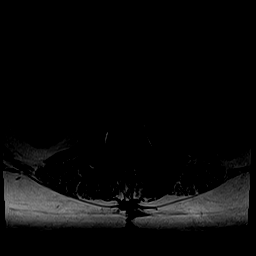

[29 of 48 positions shown; findings below may reference images not displayed]

FINDINGS: COUNT/LABELING: Please note that spinal labeling was performed assuming there are 5 non-rib-bearing lumbar type vertebrae.
Vertebral body height and alignment: Patient is status post laminectomy and transpedicular fusion involving L3 and L4 levels. Bony ankylosis identified at the L4, L5 and S1 levels.
Degenerative disc changes: No significant degenerative disc disease is present.
Conus and cauda equina: Within normal limits.
Marrow signal: The marrow signal is unremarkable.
L5-S1: Facet arthrosis. Patent spinal canal and bilateral foramina.
L4-L5: [Hemilaminectomy defect. Patent spinal canal. Moderate left foraminal stenosis. Patent right foramen.
L3-L4: No disc protrusion, canal stenosis, or foraminal stenosis.
L2-L3: Broad-based disc protrusion and facet arthrosis. Moderate to severe spinal canal stenosis. Moderate bilateral foraminal stenosis.
L1-L2: No disc protrusion, canal stenosis, or foraminal stenosis.
T12-L1: No disc protrusion, canal stenosis, or foraminal stenosis.
Visualized SI joints: Intact.
Visualized soft tissues: Unremarkable
IMPRESSION: 1.  Status post laminectomy and transpedicular fusion involving L3 and L4 levels. Bony ankylosis identified at the L4, L5 and S1 levels.
2.  Moderate left L4-5 foraminal stenosis.
3.  Moderate to severe L2-3 spinal canal stenosis with moderate bilateral foraminal stenosis.

## 2022-10-14 IMAGING — CR ANKLE LT 3 VWS MIN
3 series · 3 of 3 positions shown · non-contrast
Comparison: None

Images Obtained from [facility]
Left ankle radiographs, 3 views
INDICATION: Pain in left ankle

[x ankle ap left]
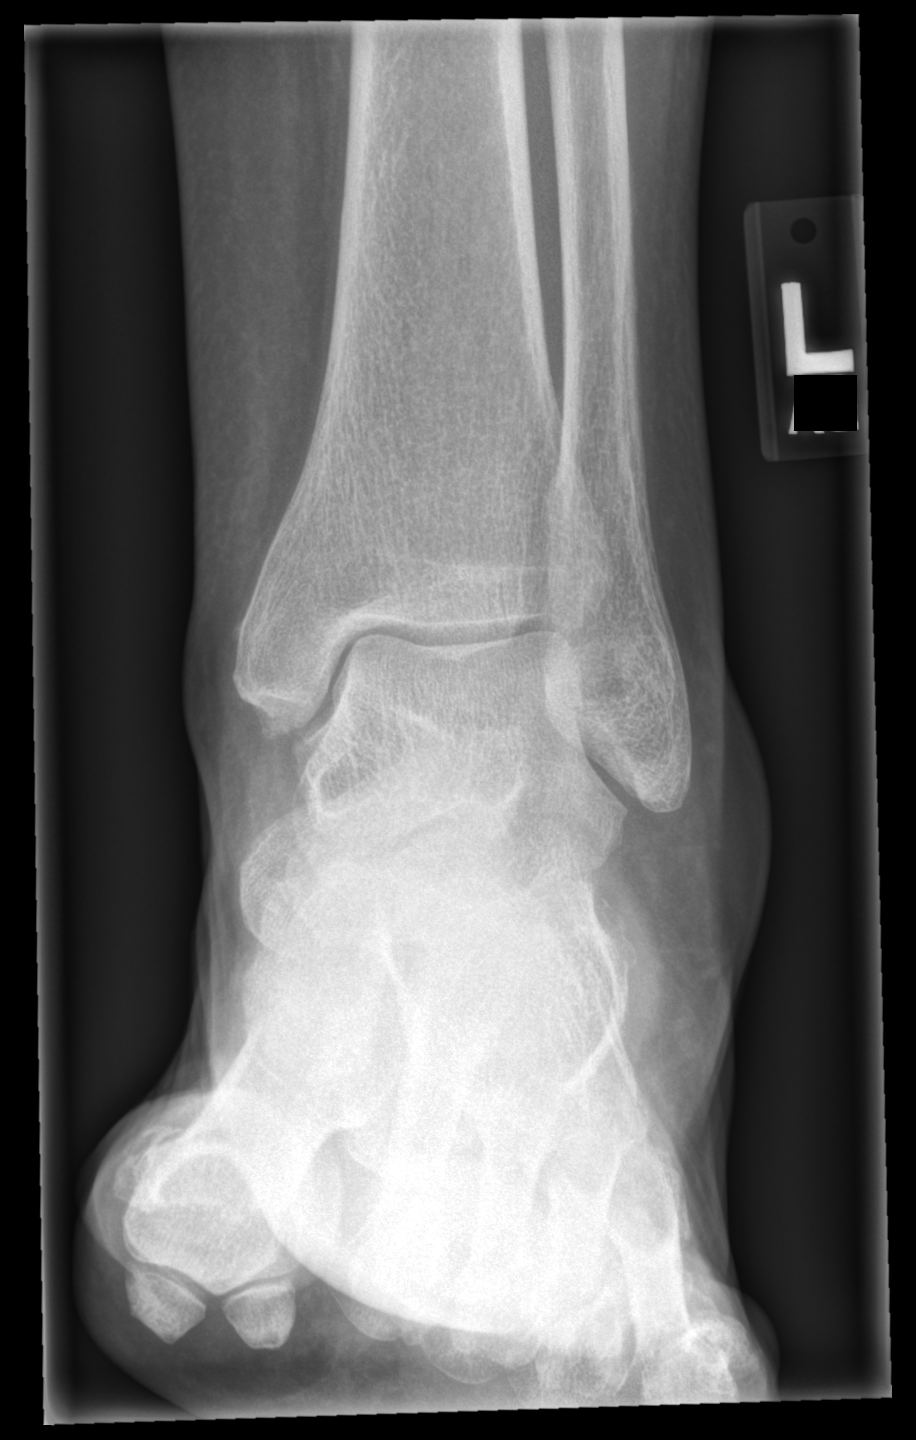

[x ankle obl left]
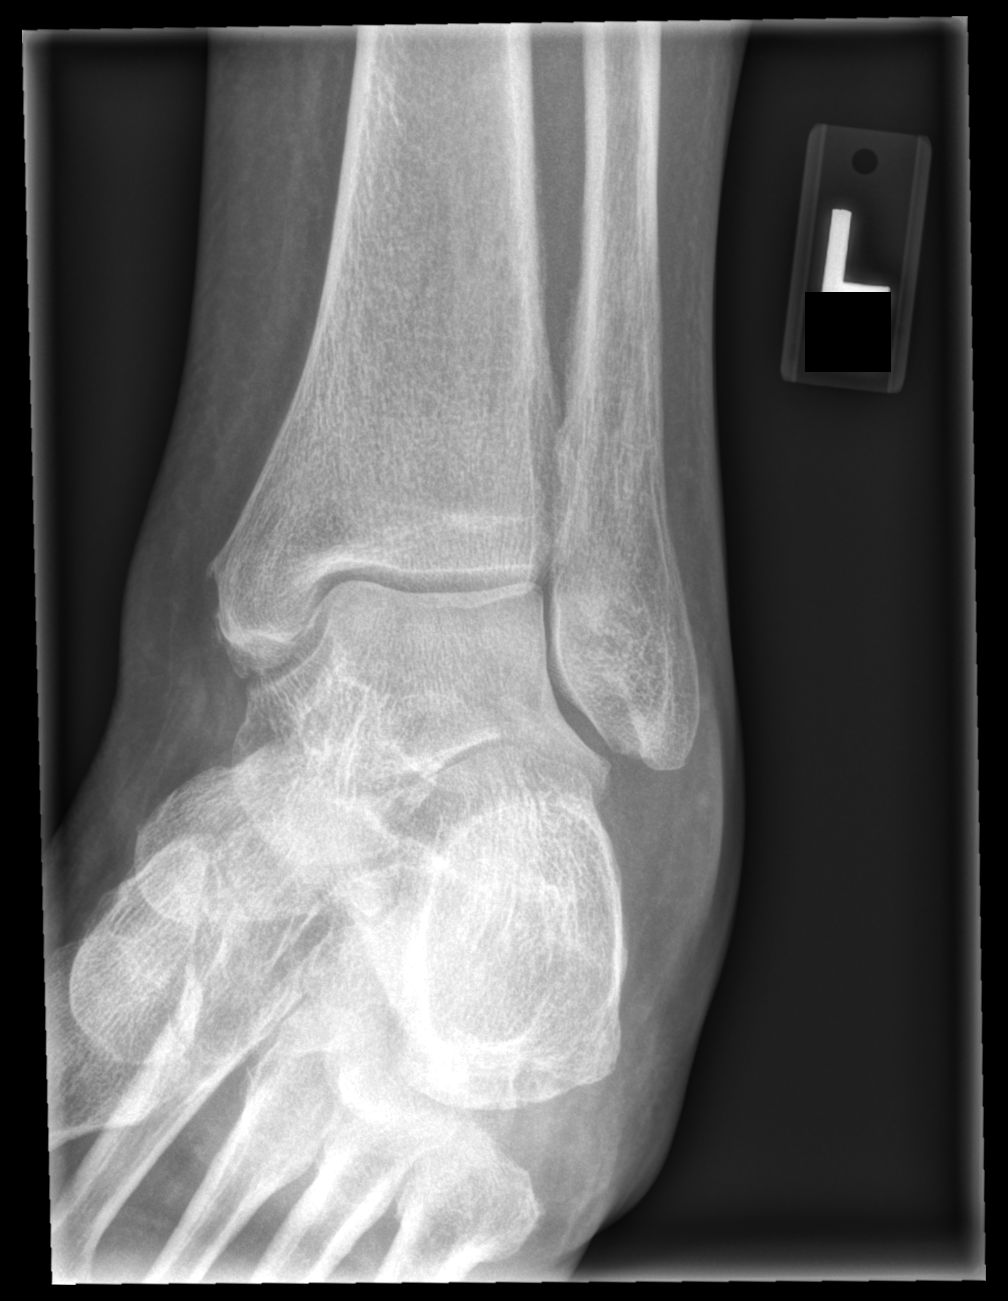

[x ankle lat left]
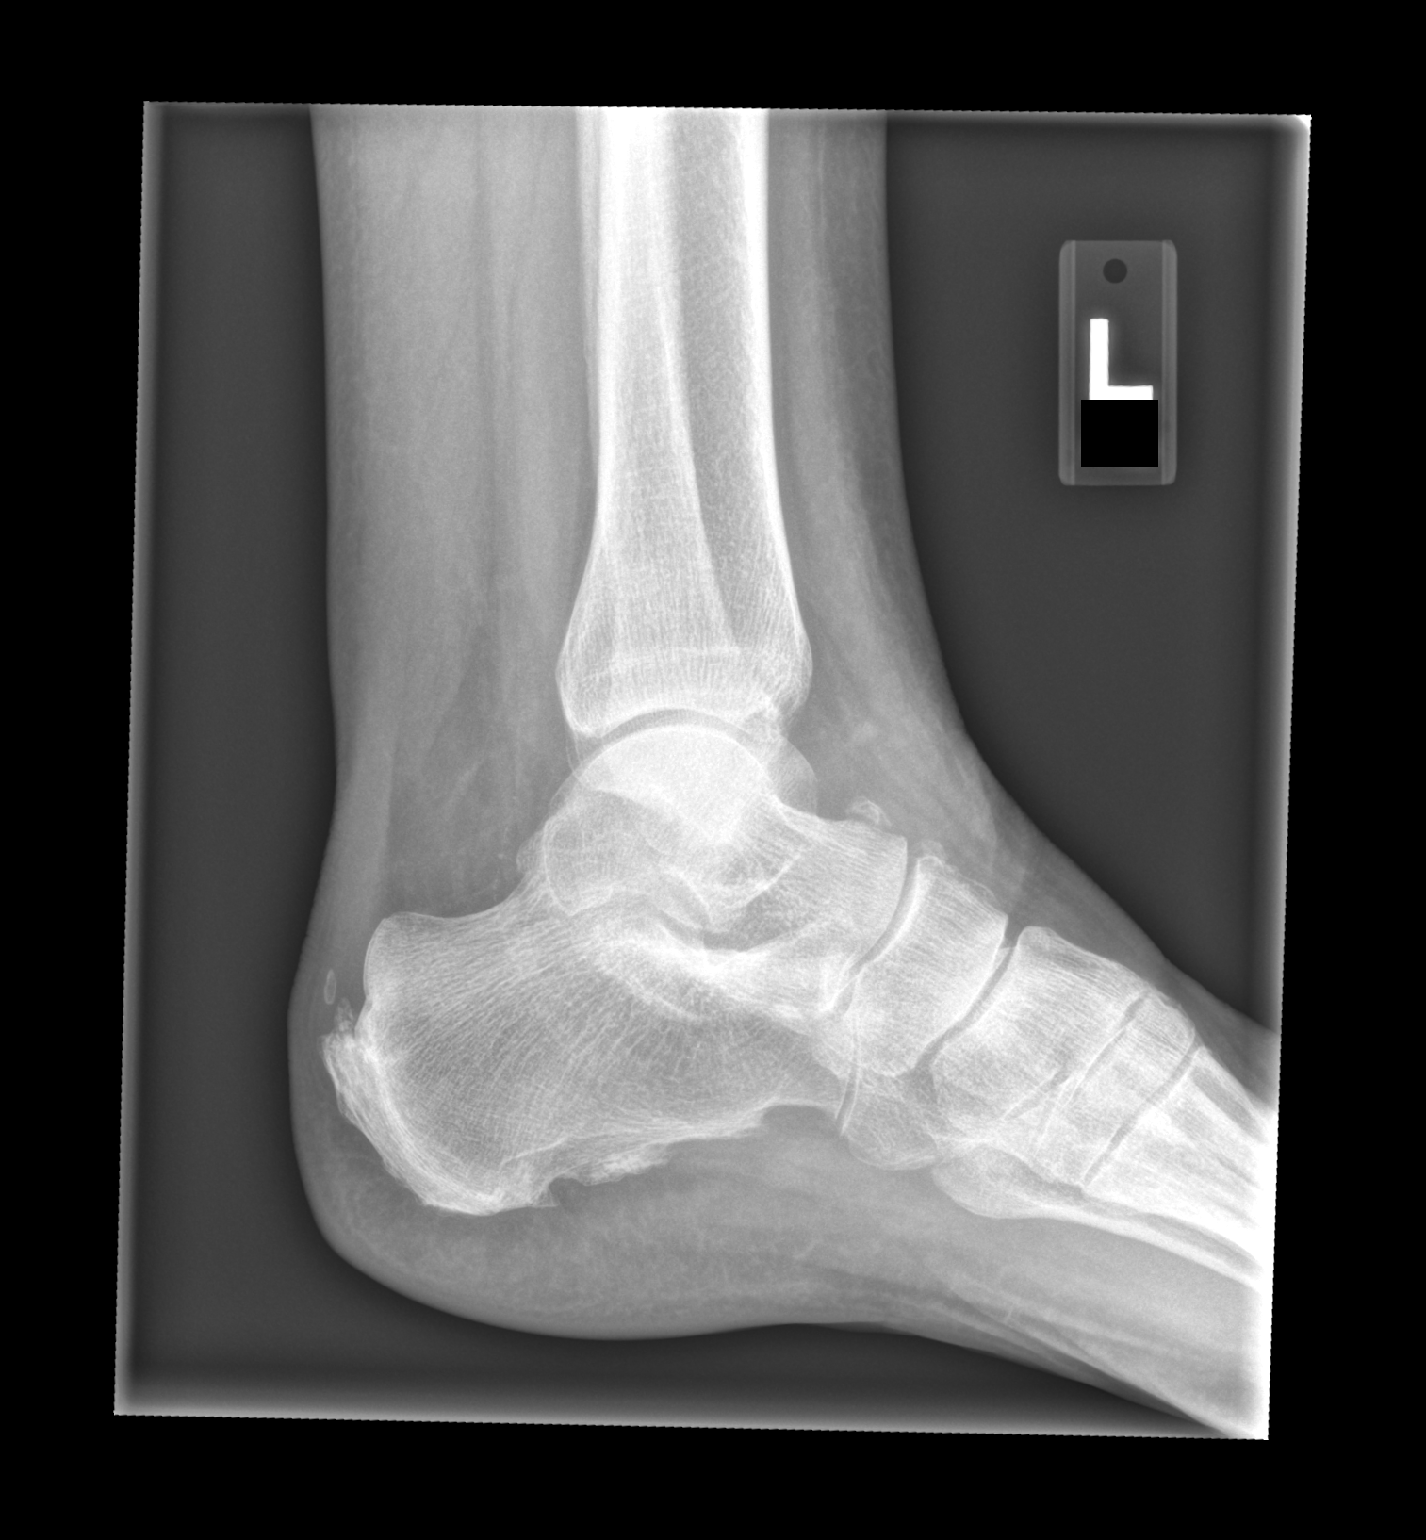

[3 of 3 positions shown; findings below may reference images not displayed]

FINDINGS: Small subacute or chronic avulsion fracture the dorsal aspect of the talar head. Small plantar posterior calcaneal enthesophytes. Soft tissue thickening along the course of the distal Achilles
tendon, which may be seen with Achilles tendinopathy.
IMPRESSION: 1.  Small subacute or chronic avulsion fracture of the dorsal aspect of the talar head. Correlate with point tenderness.
2.  Soft tissue thickening along the course of the distal Achilles tendon, which may be seen with Achilles tendinopathy.

## 5192-07-05 DEATH — deceased
